# Patient Record
Sex: Male | Born: 1935 | Race: White | Hispanic: No | Marital: Married | Smoking: Current every day smoker
Health system: Southern US, Community
[De-identification: ages and names within clinical notes are randomized; demographics above are authoritative.]

## PROBLEM LIST (undated history)

## (undated) DIAGNOSIS — F0280 Dementia in other diseases classified elsewhere without behavioral disturbance: Secondary | ICD-10-CM

## (undated) DIAGNOSIS — Z9089 Acquired absence of other organs: Secondary | ICD-10-CM

## (undated) DIAGNOSIS — J449 Chronic obstructive pulmonary disease, unspecified: Secondary | ICD-10-CM

## (undated) DIAGNOSIS — F32A Depression, unspecified: Secondary | ICD-10-CM

## (undated) DIAGNOSIS — F419 Anxiety disorder, unspecified: Secondary | ICD-10-CM

## (undated) DIAGNOSIS — C61 Malignant neoplasm of prostate: Secondary | ICD-10-CM

## (undated) DIAGNOSIS — D649 Anemia, unspecified: Secondary | ICD-10-CM

## (undated) HISTORY — DX: Malignant neoplasm of prostate: C61

## (undated) HISTORY — DX: Dementia in other diseases classified elsewhere, unspecified severity, without behavioral disturbance, psychotic disturbance, mood disturbance, and anxiety: F02.80

## (undated) HISTORY — DX: Chronic obstructive pulmonary disease, unspecified: J44.9

## (undated) HISTORY — DX: Anemia, unspecified: D64.9

## (undated) HISTORY — DX: Acquired absence of other organs: Z90.89

## (undated) HISTORY — DX: Depression, unspecified: F32.A

## (undated) HISTORY — PX: OTHER SURGICAL HISTORY: SHX169

## (undated) HISTORY — DX: Anxiety disorder, unspecified: F41.9

---

## 2007-06-27 ENCOUNTER — Ambulatory Visit: Payer: Self-pay | Admitting: Gastroenterology

## 2007-08-22 ENCOUNTER — Ambulatory Visit: Payer: Self-pay | Admitting: Internal Medicine

## 2011-07-28 ENCOUNTER — Ambulatory Visit: Payer: Self-pay | Admitting: Family Medicine

## 2011-07-30 ENCOUNTER — Ambulatory Visit: Payer: Self-pay | Admitting: Internal Medicine

## 2014-07-09 DIAGNOSIS — J449 Chronic obstructive pulmonary disease, unspecified: Secondary | ICD-10-CM | POA: Insufficient documentation

## 2015-04-17 ENCOUNTER — Ambulatory Visit
Admit: 2015-04-17 | Disposition: A | Discharge status: Home / Self Care | Payer: Self-pay | Attending: Family Medicine | Admitting: Family Medicine

## 2015-04-18 ENCOUNTER — Ambulatory Visit
Admit: 2015-04-18 | Disposition: A | Discharge status: Home / Self Care | Payer: Self-pay | Attending: Family Medicine | Admitting: Family Medicine

## 2016-04-26 ENCOUNTER — Telehealth: Payer: Self-pay | Admitting: *Deleted

## 2016-04-26 NOTE — Telephone Encounter (Signed)
Left voicemail message for patient notifying him that it is time to schedule the recommended follow up lung cancer screening low dose CT scan. Requested patient to call back to verify information prior to the scan being scheduled

## 2017-01-06 DIAGNOSIS — R7989 Other specified abnormal findings of blood chemistry: Secondary | ICD-10-CM | POA: Diagnosis not present

## 2017-01-06 DIAGNOSIS — E538 Deficiency of other specified B group vitamins: Secondary | ICD-10-CM | POA: Diagnosis not present

## 2017-01-11 DIAGNOSIS — G629 Polyneuropathy, unspecified: Secondary | ICD-10-CM | POA: Insufficient documentation

## 2017-01-11 DIAGNOSIS — G250 Essential tremor: Secondary | ICD-10-CM | POA: Insufficient documentation

## 2017-01-11 DIAGNOSIS — R2681 Unsteadiness on feet: Secondary | ICD-10-CM | POA: Diagnosis not present

## 2017-01-11 DIAGNOSIS — R269 Unspecified abnormalities of gait and mobility: Secondary | ICD-10-CM | POA: Insufficient documentation

## 2017-01-11 DIAGNOSIS — G588 Other specified mononeuropathies: Secondary | ICD-10-CM | POA: Diagnosis not present

## 2017-02-01 ENCOUNTER — Encounter: Payer: Self-pay | Admitting: Physical Therapy

## 2017-02-01 ENCOUNTER — Ambulatory Visit: Payer: PPO | Attending: Neurology | Admitting: Physical Therapy

## 2017-02-01 DIAGNOSIS — R42 Dizziness and giddiness: Secondary | ICD-10-CM | POA: Diagnosis not present

## 2017-02-01 DIAGNOSIS — R279 Unspecified lack of coordination: Secondary | ICD-10-CM

## 2017-02-01 DIAGNOSIS — R296 Repeated falls: Secondary | ICD-10-CM | POA: Diagnosis not present

## 2017-02-01 DIAGNOSIS — R262 Difficulty in walking, not elsewhere classified: Secondary | ICD-10-CM

## 2017-02-01 DIAGNOSIS — M6281 Muscle weakness (generalized): Secondary | ICD-10-CM | POA: Insufficient documentation

## 2017-02-01 NOTE — Therapy (Signed)
[facility] [address] [place], [place], [address] Phone: [phone]   Fax:  [phone]  Physical Therapy Evaluation  Patient Details  Name: Stanley Jackson MRN: [id number] Date of Birth: May 26, 1936 Referring Provider: Gurney Maxin, MD  Encounter Date: 02/01/2017      PT End of Session - 02/01/17 1529    Visit Number 1   Number of Visits 8   Date for PT Re-Evaluation 03/01/17   Authorization - Visit Number 1   Authorization - Number of Visits 10   PT Start Time 1331   PT Stop Time 1455   PT Time Calculation (min) 84 min   Equipment Utilized During Treatment Gait belt   Activity Tolerance Patient tolerated treatment well   Behavior During Therapy [facility] for tasks assessed/performed;Impulsive      History reviewed. No pertinent past medical history.  History reviewed. No pertinent surgical history.  There were no vitals filed for this visit.       Subjective Assessment - 02/01/17 1509    Subjective Patient is a 81 year old male who presents to his physical therapy evaluation for unstable gait. Patient has noted his gait has been unstable for the past year but believes it has recently gotten better in the past week after purchasing Dr. Darrick Grinder inserts for his shoes.  Patient has heaviness in his legs with dizziness that is worse in the morning. His dizziness occurs when bending over, walking, or being distracted. He reports falling 3-4 times in the last month and having multiple more near falls where he has caught himself. His falls mostly occur outside and he is an avid walker with his dog. The patient reports walking 3 miles every other day with -  mile walks in between for walking his dog. His wife is bedbound and patient is responsible for household care and does everything per patient report. [name] goals include wanting to get rid of his shakiness and decreasing the amount of falls he has.   Limitations  Standing;Walking;House hold activities   Patient Stated Goals decrease falls, decrease tremors   Currently in Pain? No/denies            [facility] PT Assessment - 02/01/17 0001      Assessment   Medical Diagnosis unstable gait   Referring Provider Gurney Maxin, MD   Onset Date/Surgical Date 02/02/16   Prior Therapy no     Precautions   Precautions Fall   Precaution Comments frequent falls     Restrictions   Weight Bearing Restrictions No     Balance Screen   Has the patient fallen in the past 6 months Yes   How many times? 3-4   Has the patient had a decrease in activity level because of a fear of falling?  No   Is the patient reluctant to leave their home because of a fear of falling?  No     Home Environment   Living Environment Private residence   Living Arrangements Spouse/significant other  takes care of bedbound wife      Right Left  Hip flexion 4+/5 4-/5  Hip Abduction 4-/5 4-/5  Hip Adduction 3+/5 3+/5  Knee Extension  4/5 4+/5  Knee Flexion 4/5 4+/5  DF 4+/5 4+/5   Transfers: all transfers caused dizziness Gait: wide BOS, shuffling steps, frequent LOB Walking in // bars with head turns: dizziness Consistent tremors throughout evaluation.  SLS: Unable to hold >2 seconds bilaterally LOB with turns and  distractions  Ambulating with/without cane: more episodes of LOB w/o cane    Gait Trng: Ambulating with cane inside and outside HEP Review: Abductors, adductors, marching, SLS, sit to stands        PT Education - 02/01/17 1455    Education provided Yes   Education Details See HEP.  Instructed in [facility] use indoors/outdoors.   Person(s) Educated Patient   Methods Explanation;Demonstration;Verbal cues;Handout   Comprehension Verbalized understanding;Returned demonstration             PT Long Term Goals - 02/01/17 1551      PT LONG TERM GOAL #1   Title Patient will score >47/56 on the Berg Balance for increased safety in ADLs and progress to  moderate risk for falls.   Baseline Patient scored a 43 on 2/6 and is ranked as significant risk for falls.    Time 4   Period Weeks   Status New     PT LONG TERM GOAL #2   Title Patient will perform 10 sit to stands without use of hands to improve patient's safety within and outside of the home.   Baseline Patient can perform 1 sit to stand with forward LOB. Able to perform 10 with use of single hand   Time 4   Period Weeks   Status New     PT LONG TERM GOAL #3   Title Patient will ambulate 40 feet with no incidences of LOB to decrease patient's risk for falls.    Baseline Patient experiences ~3 LOB every 40 ft.    Time 4   Period Weeks   Status New     PT LONG TERM GOAL #4   Title Patient will be I in HEP for increased LE MMT 4+/5 bilaterally for increased ability to perform ADLs in a safe manner.    Baseline Patient has a generalized LE MMT of 4-/5 bilaterally   Time 4   Period Weeks   Status New               Plan - 02/01/17 1548    Clinical Impression Statement Patient is a 81 year old male who presents to his physical therapy evaluation for unstable gait and falls. The patient has had multiple falls in the past month and is unsteady in standing statically and dynamically. The patient scored a 43/56 on the Berg Balance Score placing him at a significant risk for falls. His score was affected by episodes of dizziness, stumbling upon turning,  and limited balance. Dizziness was experienced upon transfers, bending over, and walking past objects that caught his attention. The patient frequently experienced LOB when ambulating in hall when distracted by open doors, people, and noises. He was able to perform head turns while walking in parrallel bars with dizziness and one episode of LOB.  The episodes of dizziness resolve quickly within 5-7 seconds. Transitioning from sit to stand without use of hands is difficult for the patient and causes excessive dizziness and forward LOB.  The patient's wide BOS and shuffling gait is also influenced by his weak abductors and adductors (4-/5 MMT bilaterally).  Patient is unable to maintain single limb stance >2 seconds bilaterally, lifting and then immediately placing back down, similar to his swing phase in gait.  The patient experiences tremors that improve with exercise.  Patient would benefit from skilled physical therapy to improve balance, decrease risk for falls, increase LE strength, and improve safety in activities of daily living.    Rehab Potential  Fair   Clinical Impairments Affecting Rehab Potential hx of falls, personal hx,    PT Frequency 2x / week   PT Duration 4 weeks   PT Treatment/Interventions ADLs/Self Care Home Management;Aquatic Therapy;Biofeedback;Electrical Stimulation;Moist Heat;DME Instruction;Gait training;Stair training;Functional mobility training;Therapeutic activities;Therapeutic exercise;Balance training;Neuromuscular re-education;Patient/family education;Manual techniques;Passive range of motion;Energy conservation;Taping;Vestibular;Visual/perceptual remediation/compensation   PT Next Visit Plan review HEP, vestibular testing, cane education   PT Home Exercise Plan see sheet   Consulted and Agree with Plan of Care Patient      Patient will benefit from skilled therapeutic intervention in order to improve the following deficits and impairments:  Abnormal gait, Decreased activity tolerance, Decreased balance, Decreased coordination, Decreased endurance, Decreased mobility, Decreased knowledge of use of DME, Decreased safety awareness, Difficulty walking, Decreased strength, Dizziness, Improper body mechanics, Postural dysfunction  Visit Diagnosis: Repeated falls  Difficulty in walking, not elsewhere classified  Dizziness and giddiness  Unspecified lack of coordination  Muscle weakness (generalized)      G-Codes - 02/17/2017 1649    Functional Assessment Tool Used Clinical impression/ gait  difficulty/ muscle weakness/ Berg/ balance issues.     Functional Limitation Mobility: Walking and moving around   Mobility: Walking and Moving Around Current Status [phone]) At least 40 percent but less than 60 percent impaired, limited or restricted   Mobility: Walking and Moving Around Goal Status [phone]) At least 1 percent but less than 20 percent impaired, limited or restricted       Problem List There are no active problems to display for this patient.  [name], PT, DPT # [id number] Janna Arch, SPT 02/02/2017, 7:50 AM  [place] [facility] [address] [place], [place], [address] Phone: [phone]   Fax:  [phone]  Name: VERNA EANS MRN: [id number] Date of Birth: 06-12-1936

## 2017-02-02 ENCOUNTER — Encounter: Payer: Self-pay | Admitting: Physical Therapy

## 2017-02-04 ENCOUNTER — Ambulatory Visit: Payer: PPO | Admitting: Physical Therapy

## 2017-02-04 DIAGNOSIS — R296 Repeated falls: Secondary | ICD-10-CM | POA: Diagnosis not present

## 2017-02-04 DIAGNOSIS — M6281 Muscle weakness (generalized): Secondary | ICD-10-CM

## 2017-02-04 DIAGNOSIS — R262 Difficulty in walking, not elsewhere classified: Secondary | ICD-10-CM

## 2017-02-04 DIAGNOSIS — R42 Dizziness and giddiness: Secondary | ICD-10-CM

## 2017-02-04 DIAGNOSIS — R279 Unspecified lack of coordination: Secondary | ICD-10-CM

## 2017-02-05 NOTE — Therapy (Signed)
[facility] [facility] [address] [place], [place], [address] Phone: [phone]   Fax:  [phone]  Physical Therapy Treatment  Patient Details  Name: Stanley Jackson MRN: [id number] Date of Birth: September 29, 1936 Referring Provider: Gurney Maxin, MD  Encounter Date: 02/04/2017      PT End of Session - 02/04/17 1022    Visit Number 2   Number of Visits 8   Date for PT Re-Evaluation 03/01/17   Authorization - Visit Number 2   Authorization - Number of Visits 10   PT Start Time [id number]   PT Stop Time 1111   PT Time Calculation (min) 52 min   Equipment Utilized During Treatment Gait belt   Activity Tolerance Patient tolerated treatment well   Behavior During Therapy [facility] for tasks assessed/performed      No past medical history on file.  No past surgical history on file.  There were no vitals filed for this visit.      Subjective Assessment - 02/04/17 1020    Subjective Pt. presents today with no c/o pain and reports he is doing well today.    Limitations Standing;Walking;House hold activities   Patient Stated Goals decrease falls, decrease tremors   Currently in Pain? No/denies       OBJECTIVE:  There.ex.: Reviewed HEP.  Scifit L6 10 min. B UE/LE (warm-up/no charge).  TG knee flexion/ single leg/ heel raises 30x.  Neuro: balance training with Airex/ BOSU (SLS/ wt. Shifting/ limited UE assist). SLS with finger tip assist (15 sec.).  5 seconds on R and 8 seconds on L with no UE assist.  Walking in clinic with no assistive device working on proper step pattern/ upright posture.  Airex: marching/ EO and EC tasks.      Pt response for medical necessity:  Benefits from B LE muscle strengthening to improve safety and independence with standing/walking tasks.  Good overall tx. Tolerance with several small LOB and able to self correct UE assist.        PT Long Term Goals - 02/01/17 1551      PT LONG TERM GOAL #1   Title Patient will score  >47/56 on the Berg Balance for increased safety in ADLs and progress to moderate risk for falls.   Baseline Patient scored a 43 on 2/6 and is ranked as significant risk for falls.    Time 4   Period Weeks   Status New     PT LONG TERM GOAL #2   Title Patient will perform 10 sit to stands without use of hands to improve patient's safety within and outside of the home.   Baseline Patient can perform 1 sit to stand with forward LOB. Able to perform 10 with use of single hand   Time 4   Period Weeks   Status New     PT LONG TERM GOAL #3   Title Patient will ambulate 40 feet with no incidences of LOB to decrease patient's risk for falls.    Baseline Patient experiences ~3 LOB every 40 ft.    Time 4   Period Weeks   Status New     PT LONG TERM GOAL #4   Title Patient will be I in HEP for increased LE MMT 4+/5 bilaterally for increased ability to perform ADLs in a safe manner.    Baseline Patient has a generalized LE MMT of 4-/5 bilaterally   Time 4   Period Weeks   Status New  Plan - 02/05/17 1500    Clinical Impression Statement Pt. works hard during tx. session and progressing well with higher level balance tasks in/out of //-bars.  Pt. requires use of gait belt for safety due to several small LOB with ability to self correct with UE assist.  Pt. reports no pain during tx. session.  Pt. is not interested in use of SPC with walking at this time.  Less overall dizziness reported today as compared to initial evaluation.     Rehab Potential Fair   Clinical Impairments Affecting Rehab Potential hx of falls, personal hx,    PT Frequency 2x / week   PT Duration 4 weeks   PT Treatment/Interventions ADLs/Self Care Home Management;Aquatic Therapy;Biofeedback;Electrical Stimulation;Moist Heat;DME Instruction;Gait training;Stair training;Functional mobility training;Therapeutic activities;Therapeutic exercise;Balance training;Neuromuscular re-education;Patient/family education;Manual  techniques;Passive range of motion;Energy conservation;Taping;Vestibular;Visual/perceptual remediation/compensation   PT Next Visit Plan review HEP, vestibular testing, cane education   PT Home Exercise Plan see sheet      Patient will benefit from skilled therapeutic intervention in order to improve the following deficits and impairments:  Abnormal gait, Decreased activity tolerance, Decreased balance, Decreased coordination, Decreased endurance, Decreased mobility, Decreased knowledge of use of DME, Decreased safety awareness, Difficulty walking, Decreased strength, Dizziness, Improper body mechanics, Postural dysfunction  Visit Diagnosis: Repeated falls  Difficulty in walking, not elsewhere classified  Dizziness and giddiness  Unspecified lack of coordination  Muscle weakness (generalized)     Problem List There are no active problems to display for this patient.  [name], PT, DPT # [phone]  02/05/2017, 3:08 PM  [place] [facility] [address] [place], [place], [address] Phone: [phone]   Fax:  [phone]  Name: Stanley Jackson MRN: [id number] Date of Birth: 1936-02-19

## 2017-02-07 DIAGNOSIS — R7989 Other specified abnormal findings of blood chemistry: Secondary | ICD-10-CM | POA: Diagnosis not present

## 2017-02-07 DIAGNOSIS — E538 Deficiency of other specified B group vitamins: Secondary | ICD-10-CM | POA: Diagnosis not present

## 2017-02-08 ENCOUNTER — Ambulatory Visit: Payer: PPO | Admitting: Physical Therapy

## 2017-02-09 ENCOUNTER — Ambulatory Visit: Payer: PPO | Admitting: Physical Therapy

## 2017-02-09 ENCOUNTER — Encounter: Payer: Self-pay | Admitting: Physical Therapy

## 2017-02-09 DIAGNOSIS — R279 Unspecified lack of coordination: Secondary | ICD-10-CM

## 2017-02-09 DIAGNOSIS — R296 Repeated falls: Secondary | ICD-10-CM | POA: Diagnosis not present

## 2017-02-09 DIAGNOSIS — M6281 Muscle weakness (generalized): Secondary | ICD-10-CM

## 2017-02-09 DIAGNOSIS — R262 Difficulty in walking, not elsewhere classified: Secondary | ICD-10-CM

## 2017-02-09 DIAGNOSIS — R42 Dizziness and giddiness: Secondary | ICD-10-CM

## 2017-02-09 NOTE — Therapy (Signed)
[place] [facility] [address] [place], [place], [address] Phone: [phone]   Fax:  [phone]  Physical Therapy Treatment  Patient Details  Name: Stanley Jackson MRN: [id number] Date of Birth: January 24, 1936 Referring Provider: Gurney Maxin, MD  Encounter Date: 02/09/2017      PT End of Session - 02/09/17 1131    Visit Number 3   Number of Visits 8   Date for PT Re-Evaluation 03/01/17   Authorization - Visit Number 3   Authorization - Number of Visits 10   PT Start Time [id number]   PT Stop Time [id number]   PT Time Calculation (min) 48 min   Equipment Utilized During Treatment Gait belt   Activity Tolerance Patient tolerated treatment well   Behavior During Therapy [facility] for tasks assessed/performed      History reviewed. No pertinent past medical history.  History reviewed. No pertinent surgical history.  There were no vitals filed for this visit.      Subjective Assessment - 02/09/17 1130    Subjective Pt. reports no new complaints today and played with his granddaughter over the weekend.    Limitations Standing;Walking;House hold activities   Patient Stated Goals decrease falls, decrease tremors   Currently in Pain? No/denies     OBJECTIVE:   Neuro: Navigating obstacle course in clinic: weaving through 3 cones with no LOB, step up onto 6" step, medicine ball rebounds standing on airex pad. Min. A req.to maintain balance during dynamic throwing exercise. Marland Kitchen Perturbations standing with eyes open progressed to eyes closed with CGA for safety. Pt. showed a tendency to lean trunk forward when feeling off-balance. Pt. used visual feedback in mirror to correct posture and head alignment. Sit to stands no UE assist. Pt. Showing improvement from last week with control during stand to sit. Multiple trials of ambulation through cones carrying weighted box, pt. Reported less dizziness from initial evaluation. Standing medicine ball passing with rotation  15x and overhead passing 15x. No LOB noted    There.ex.: Squat to pick up medicine ball w/ PT cuing to increase B knee flexion during squat. TG knee flexion double/single leg/heel raises 30x. Pt. Provided cuing to perform in a slow and controlled manner (L side stronger than R). Pt. Needed tactile cuing to maintain knee extension during heel raises. Ambulation with weighted box 7.5# inside box. Standing marching ankle weights w/ no UE assist, standing abduction w/ ankle weights and support on //-bars. Tendency to lean forward. SciFit L4.5 (no charge)   Pt response for medical necessity:  Benefits from B LE muscle strengthening to improve safety and independence with standing/walking tasks. Tolerance with several small LOB and able to self correct UE assist.           PT Long Term Goals - 02/01/17 1551      PT LONG TERM GOAL #1   Title Patient will score >47/56 on the Berg Balance for increased safety in ADLs and progress to moderate risk for falls.   Baseline Patient scored a 43 on 2/6 and is ranked as significant risk for falls.    Time 4   Period Weeks   Status New     PT LONG TERM GOAL #2   Title Patient will perform 10 sit to stands without use of hands to improve patient's safety within and outside of the home.   Baseline Patient can perform 1 sit to stand with forward LOB. Able to perform 10 with use of single hand  Time 4   Period Weeks   Status New     PT LONG TERM GOAL #3   Title Patient will ambulate 40 feet with no incidences of LOB to decrease patient's risk for falls.    Baseline Patient experiences ~3 LOB every 40 ft.    Time 4   Period Weeks   Status New     PT LONG TERM GOAL #4   Title Patient will be I in HEP for increased LE MMT 4+/5 bilaterally for increased ability to perform ADLs in a safe manner.    Baseline Patient has a generalized LE MMT of 4-/5 bilaterally   Time 4   Period Weeks   Status New            Plan - 02/09/17 1132    Clinical  Impression Statement Pt. continuing to progress with higher level balance tasks outside of parallel bars. Pt. able to navigate obstacle course within PT clinic working on pivoting/turning during gait, step ups to 12" steps, and dynamic activities on the airex. Pt. ambulates with a slight shuffling gait pattern showing a decreased heel strike and short step length. Pt. experiences brief spells of dizziness occasionally but it does not affect the pt's ability to participate in therapy.  Pt. progressing to standing balance tasks with eyes closed to increase difficulty. Pt's overall endurance is improving with ambulating tasks and while on the bicycle.    Rehab Potential Fair   Clinical Impairments Affecting Rehab Potential hx of falls, personal hx,    PT Frequency 2x / week   PT Duration 4 weeks   PT Treatment/Interventions ADLs/Self Care Home Management;Aquatic Therapy;Biofeedback;Electrical Stimulation;Moist Heat;DME Instruction;Gait training;Stair training;Functional mobility training;Therapeutic activities;Therapeutic exercise;Balance training;Neuromuscular re-education;Patient/family education;Manual techniques;Passive range of motion;Energy conservation;Taping;Vestibular;Visual/perceptual remediation/compensation   PT Next Visit Plan review HEP, vestibular testing, cane education   PT Home Exercise Plan see sheet   Consulted and Agree with Plan of Care Patient      Patient will benefit from skilled therapeutic intervention in order to improve the following deficits and impairments:  Abnormal gait, Decreased activity tolerance, Decreased balance, Decreased coordination, Decreased endurance, Decreased mobility, Decreased knowledge of use of DME, Decreased safety awareness, Difficulty walking, Decreased strength, Dizziness, Improper body mechanics, Postural dysfunction  Visit Diagnosis: Repeated falls  Difficulty in walking, not elsewhere classified  Dizziness and giddiness  Unspecified lack of  coordination  Muscle weakness (generalized)     Problem List There are no active problems to display for this patient.  [name], PT, DPT # [id number] [name], SPT 02/09/2017, 1:25 PM  [place] [facility] [address] [place], [place], [address] Phone: [phone]   Fax:  [phone]  Name: DELANDO PUJOLS MRN: [id number] Date of Birth: 26-Aug-1936

## 2017-02-16 ENCOUNTER — Ambulatory Visit: Payer: PPO | Admitting: Physical Therapy

## 2017-02-16 DIAGNOSIS — R296 Repeated falls: Secondary | ICD-10-CM | POA: Diagnosis not present

## 2017-02-16 DIAGNOSIS — R262 Difficulty in walking, not elsewhere classified: Secondary | ICD-10-CM

## 2017-02-16 DIAGNOSIS — R279 Unspecified lack of coordination: Secondary | ICD-10-CM

## 2017-02-16 DIAGNOSIS — M6281 Muscle weakness (generalized): Secondary | ICD-10-CM

## 2017-02-16 DIAGNOSIS — R42 Dizziness and giddiness: Secondary | ICD-10-CM
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
## 2017-02-23 ENCOUNTER — Ambulatory Visit: Payer: PPO | Admitting: Physical Therapy

## 2017-03-02 ENCOUNTER — Ambulatory Visit: Payer: PPO | Attending: Neurology | Admitting: Physical Therapy

## 2017-03-10 DIAGNOSIS — R7989 Other specified abnormal findings of blood chemistry: Secondary | ICD-10-CM | POA: Diagnosis not present

## 2017-03-10 DIAGNOSIS — E538 Deficiency of other specified B group vitamins: Secondary | ICD-10-CM | POA: Diagnosis not present

## 2017-03-24 DIAGNOSIS — Z0001 Encounter for general adult medical examination with abnormal findings: Secondary | ICD-10-CM | POA: Diagnosis not present

## 2017-03-24 DIAGNOSIS — E538 Deficiency of other specified B group vitamins: Secondary | ICD-10-CM | POA: Diagnosis not present

## 2017-03-24 DIAGNOSIS — J449 Chronic obstructive pulmonary disease, unspecified: Secondary | ICD-10-CM | POA: Diagnosis not present

## 2017-03-24 DIAGNOSIS — Z720 Tobacco use: Secondary | ICD-10-CM | POA: Diagnosis not present

## 2017-03-24 DIAGNOSIS — Z0000 Encounter for general adult medical examination without abnormal findings: Secondary | ICD-10-CM | POA: Diagnosis not present

## 2017-04-11 DIAGNOSIS — E538 Deficiency of other specified B group vitamins: Secondary | ICD-10-CM | POA: Diagnosis not present

## 2017-04-11 DIAGNOSIS — R7989 Other specified abnormal findings of blood chemistry: Secondary | ICD-10-CM | POA: Diagnosis not present

## 2017-04-19 DIAGNOSIS — G588 Other specified mononeuropathies: Secondary | ICD-10-CM | POA: Diagnosis not present

## 2017-04-19 DIAGNOSIS — R2681 Unsteadiness on feet: Secondary | ICD-10-CM | POA: Diagnosis not present

## 2017-04-19 DIAGNOSIS — G250 Essential tremor: Secondary | ICD-10-CM | POA: Diagnosis not present

## 2017-04-21 DIAGNOSIS — J449 Chronic obstructive pulmonary disease, unspecified: Secondary | ICD-10-CM | POA: Diagnosis not present

## 2017-04-21 DIAGNOSIS — R05 Cough: Secondary | ICD-10-CM | POA: Diagnosis not present

## 2017-04-22 ENCOUNTER — Other Ambulatory Visit: Payer: Self-pay | Admitting: Specialist

## 2017-04-22 DIAGNOSIS — R05 Cough: Secondary | ICD-10-CM

## 2017-04-22 DIAGNOSIS — J449 Chronic obstructive pulmonary disease, unspecified: Secondary | ICD-10-CM

## 2017-04-22 DIAGNOSIS — R059 Cough, unspecified: Secondary | ICD-10-CM

## 2017-04-29 ENCOUNTER — Ambulatory Visit
Admission: RE | Admit: 2017-04-29 | Discharge: 2017-04-29 | Disposition: A | Discharge status: Home / Self Care | Payer: PPO | Source: Ambulatory Visit | Attending: Specialist | Admitting: Specialist

## 2017-04-29 DIAGNOSIS — I700 Atherosclerosis of aorta: Secondary | ICD-10-CM | POA: Insufficient documentation

## 2017-04-29 DIAGNOSIS — M47814 Spondylosis without myelopathy or radiculopathy, thoracic region: Secondary | ICD-10-CM | POA: Insufficient documentation

## 2017-04-29 DIAGNOSIS — R05 Cough: Secondary | ICD-10-CM | POA: Insufficient documentation

## 2017-04-29 DIAGNOSIS — R059 Cough, unspecified: Secondary | ICD-10-CM

## 2017-04-29 DIAGNOSIS — N62 Hypertrophy of breast: Secondary | ICD-10-CM | POA: Diagnosis not present

## 2017-04-29 DIAGNOSIS — R918 Other nonspecific abnormal finding of lung field: Secondary | ICD-10-CM | POA: Diagnosis not present

## 2017-04-29 DIAGNOSIS — J449 Chronic obstructive pulmonary disease, unspecified: Secondary | ICD-10-CM | POA: Diagnosis not present

## 2017-05-12 DIAGNOSIS — R7989 Other specified abnormal findings of blood chemistry: Secondary | ICD-10-CM | POA: Diagnosis not present

## 2017-05-12 DIAGNOSIS — E538 Deficiency of other specified B group vitamins: Secondary | ICD-10-CM | POA: Diagnosis not present

## 2017-06-16 DIAGNOSIS — R7989 Other specified abnormal findings of blood chemistry: Secondary | ICD-10-CM | POA: Diagnosis not present

## 2017-07-03 IMAGING — CT CT CHEST W/O CM
1 series · 15 of 34 positions shown, 19 images · non-contrast
Comparison: Lung cancer screening chest CT 04/18/2015. Chest CT
08/22/2007.

CLINICAL DATA: Current smoker with history of COPD. Slight cough.
No history of malignancy. Patient no longer qualifies for lung
cancer screening program.

EXAM:
CT CHEST WITHOUT CONTRAST
TECHNIQUE: Multidetector CT imaging of the chest was performed following the
standard protocol without IV contrast.

[Series 2: thorax · axial · 0.65mm/px · z∈[-566,-294]mm · 15 of 160 slices shown, 19 images]
[im 12/160  mediastinal]
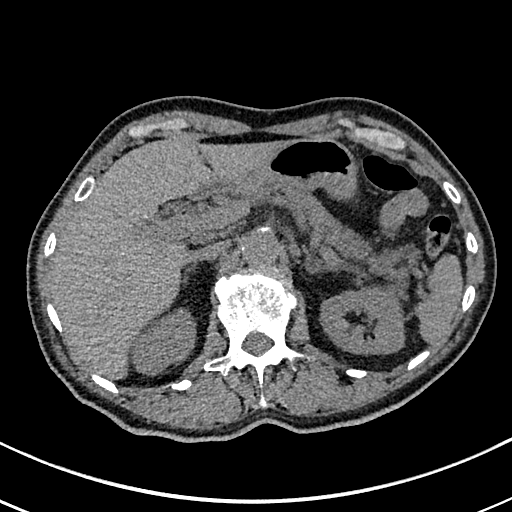
[im 12/160  lung]
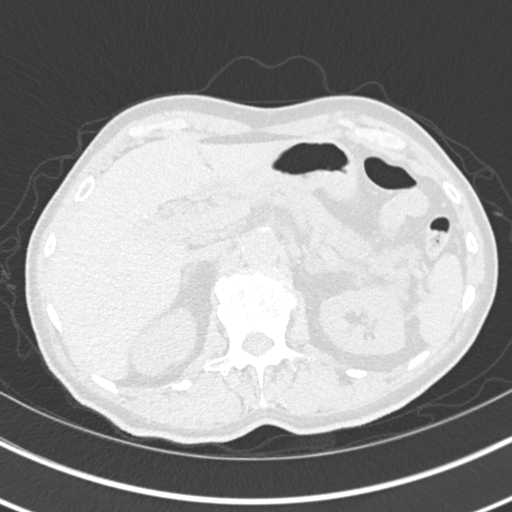
[im 24/160  lung]
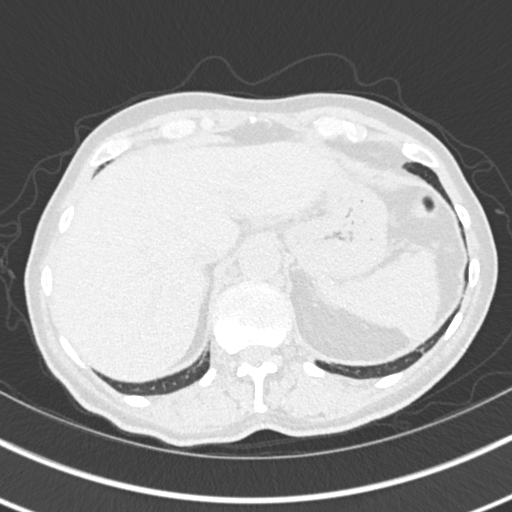
[im 32/160  lung]
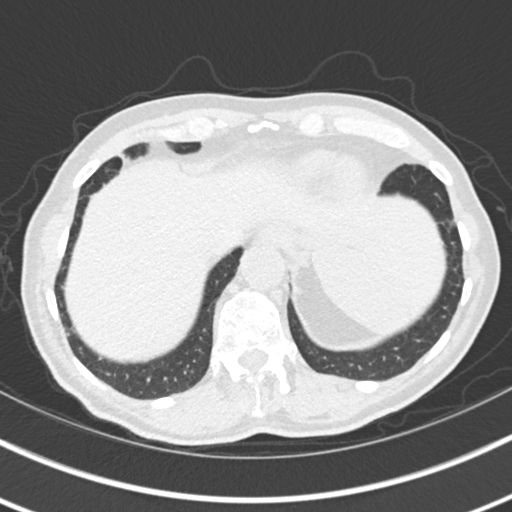
[im 42/160  lung]
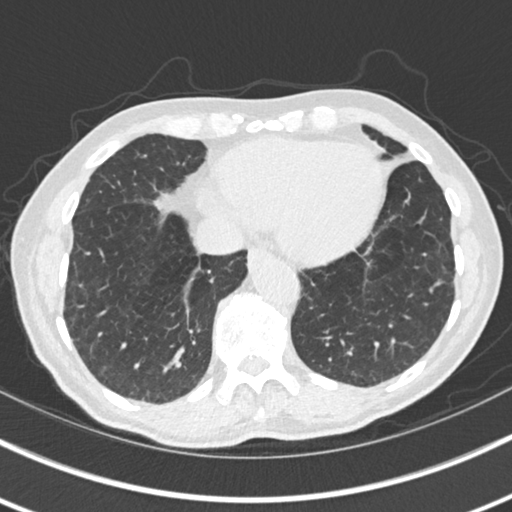
[im 54/160  mediastinal]
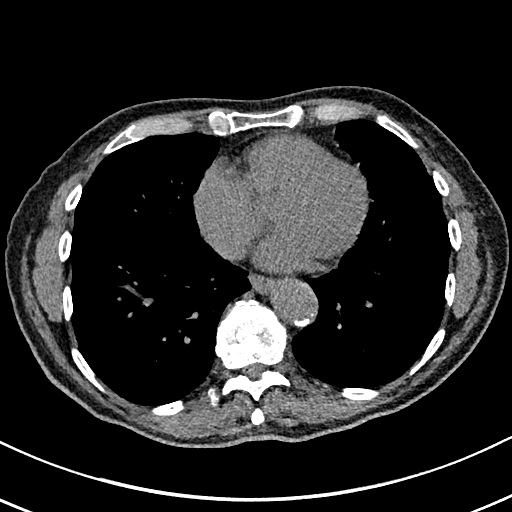
[im 54/160  lung]
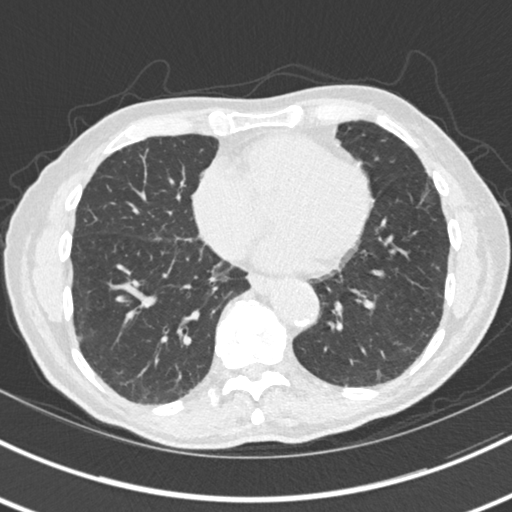
[im 64/160  lung]
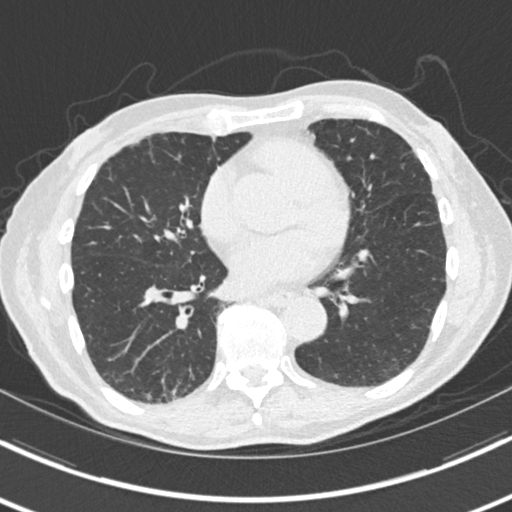
[im 71/160  lung]
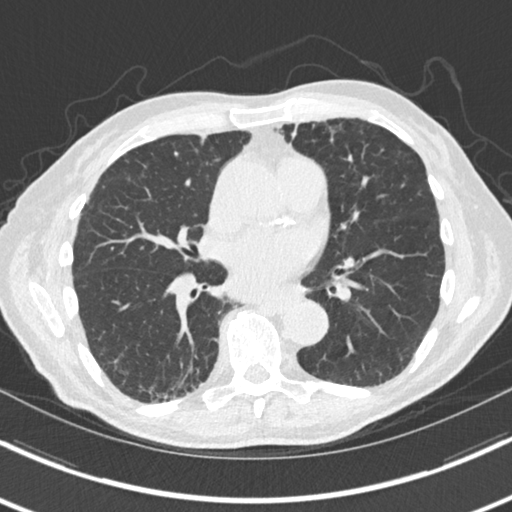
[im 83/160  lung]
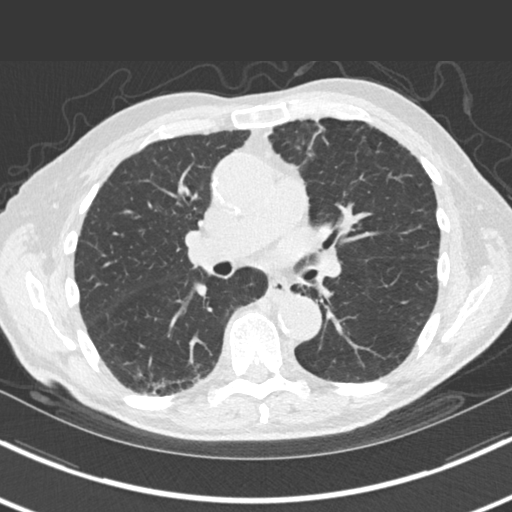
[im 89/160  mediastinal]
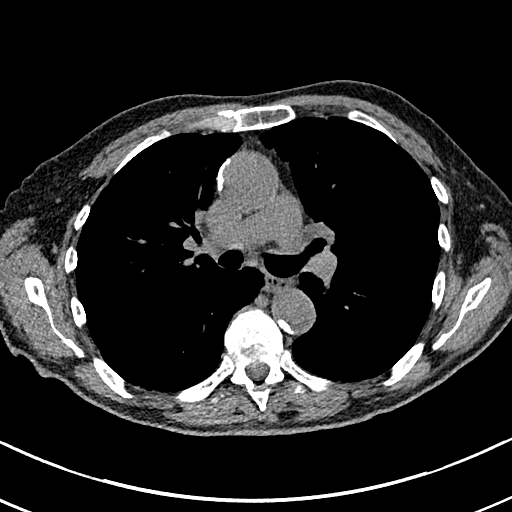
[im 89/160  lung]
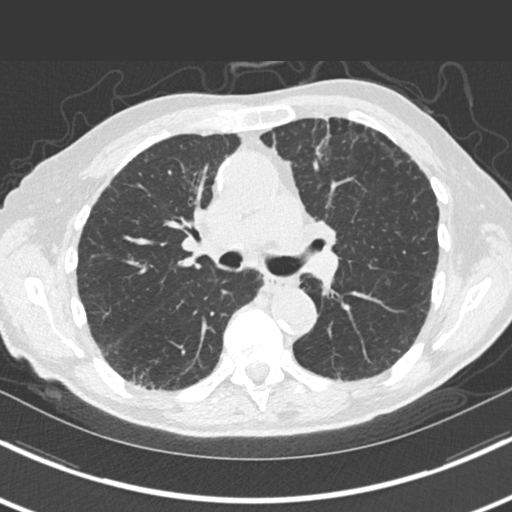
[im 96/160  lung]
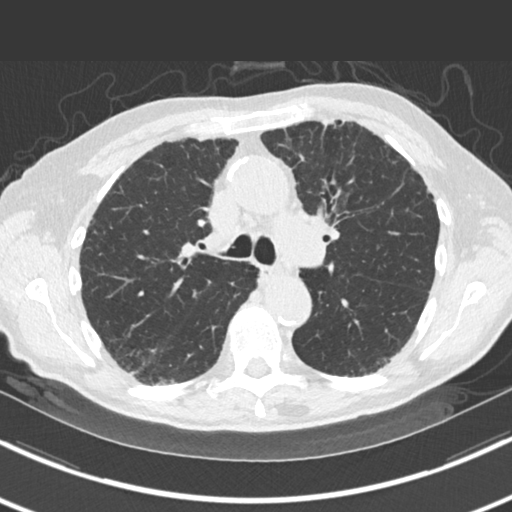
[im 107/160  lung]
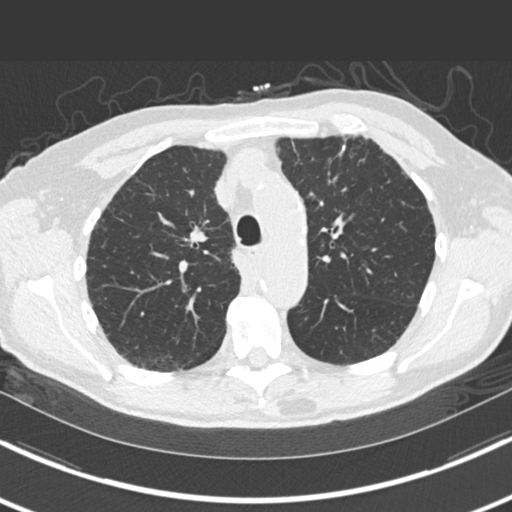
[im 118/160  lung]
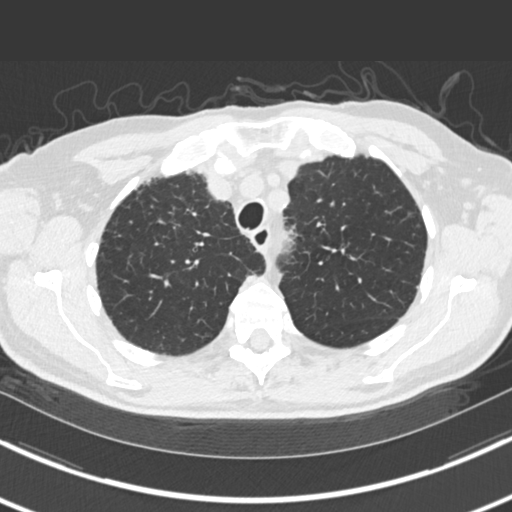
[im 128/160  mediastinal]
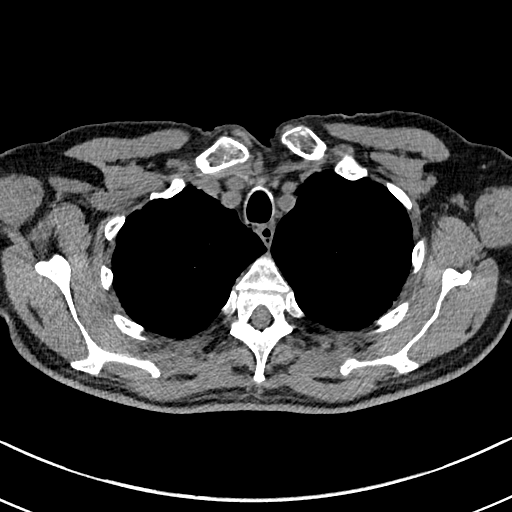
[im 128/160  lung]
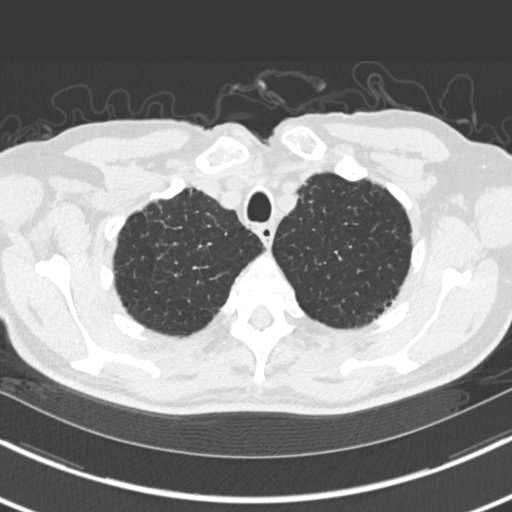
[im 136/160  lung]
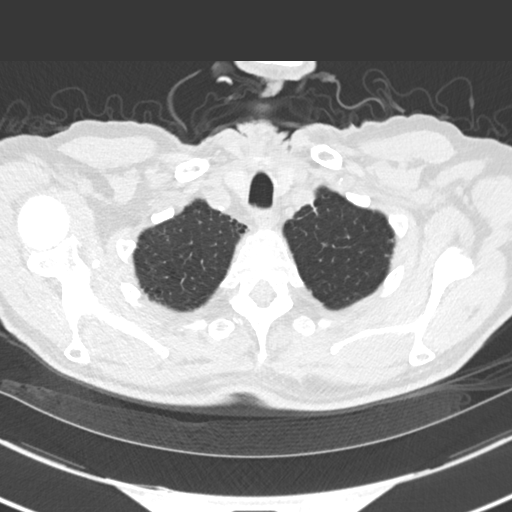
[im 148/160  lung]
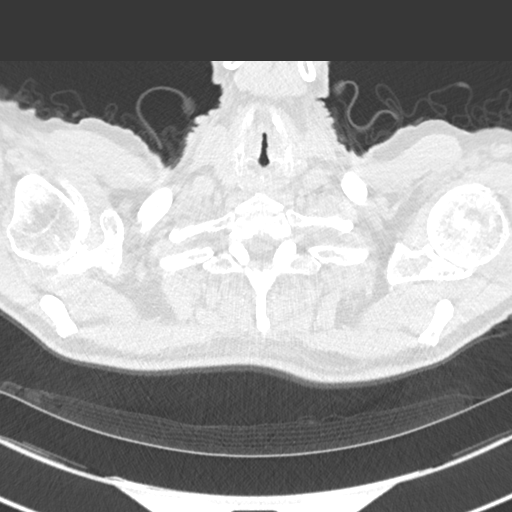

[15 of 34 positions shown; findings below may reference images not displayed]

FINDINGS: Cardiovascular: Atherosclerosis of aorta, great vessels and coronary
arteries. No acute vascular findings are seen on noncontrast
imaging. The heart size is normal. There is no pericardial effusion.

Mediastinum/Nodes: There are no enlarged mediastinal, hilar or
axillary lymph nodes.Hilar assessment is limited by the lack of
intravenous contrast, although the hilar contours appear unchanged.
The thyroid gland, trachea and esophagus demonstrate no significant
findings.

Lungs/Pleura: There is no pleural effusion. Again demonstrated is
mild emphysema with central airway thickening and scattered
subpleural reticulation. Minimal subpleural nodularity is stable.
There are no suspicious pulmonary nodules or confluent airspace
opacities.

Upper abdomen:  Unremarkable.  There is no adrenal mass.

Musculoskeletal/Chest wall: There is no chest wall mass or
suspicious osseous finding. Thoracic spine degenerative changes and
mild bilateral gynecomastia noted.
IMPRESSION: 1. Stable chronic lung disease with emphysema, subpleural
reticulation and scattered minimal nodularity. No suspicious
pulmonary nodules.
2. No acute chest findings or adenopathy.
3.  Aortic Atherosclerosis ([id number]).

## 2017-09-01 DIAGNOSIS — Z720 Tobacco use: Secondary | ICD-10-CM | POA: Diagnosis not present

## 2017-09-01 DIAGNOSIS — E538 Deficiency of other specified B group vitamins: Secondary | ICD-10-CM | POA: Diagnosis not present

## 2017-09-01 DIAGNOSIS — G588 Other specified mononeuropathies: Secondary | ICD-10-CM | POA: Diagnosis not present

## 2017-09-01 DIAGNOSIS — J449 Chronic obstructive pulmonary disease, unspecified: Secondary | ICD-10-CM | POA: Diagnosis not present

## 2017-10-27 DIAGNOSIS — J449 Chronic obstructive pulmonary disease, unspecified: Secondary | ICD-10-CM | POA: Diagnosis not present

## 2017-10-27 DIAGNOSIS — R0609 Other forms of dyspnea: Secondary | ICD-10-CM | POA: Diagnosis not present

## 2017-10-27 DIAGNOSIS — Z23 Encounter for immunization: Secondary | ICD-10-CM | POA: Diagnosis not present

## 2018-03-27 DIAGNOSIS — E538 Deficiency of other specified B group vitamins: Secondary | ICD-10-CM | POA: Diagnosis not present

## 2018-03-27 DIAGNOSIS — Z0001 Encounter for general adult medical examination with abnormal findings: Secondary | ICD-10-CM | POA: Diagnosis not present

## 2018-03-27 DIAGNOSIS — J449 Chronic obstructive pulmonary disease, unspecified: Secondary | ICD-10-CM | POA: Diagnosis not present

## 2018-03-27 DIAGNOSIS — Z5181 Encounter for therapeutic drug level monitoring: Secondary | ICD-10-CM | POA: Diagnosis not present

## 2018-03-27 DIAGNOSIS — Z0000 Encounter for general adult medical examination without abnormal findings: Secondary | ICD-10-CM | POA: Diagnosis not present

## 2018-04-25 DIAGNOSIS — E538 Deficiency of other specified B group vitamins: Secondary | ICD-10-CM | POA: Diagnosis not present

## 2018-06-21 DIAGNOSIS — R911 Solitary pulmonary nodule: Secondary | ICD-10-CM | POA: Diagnosis not present

## 2018-06-21 DIAGNOSIS — R0602 Shortness of breath: Secondary | ICD-10-CM | POA: Diagnosis not present

## 2018-06-21 DIAGNOSIS — R0609 Other forms of dyspnea: Secondary | ICD-10-CM | POA: Diagnosis not present

## 2018-06-21 DIAGNOSIS — J449 Chronic obstructive pulmonary disease, unspecified: Secondary | ICD-10-CM | POA: Diagnosis not present

## 2018-06-21 DIAGNOSIS — Z720 Tobacco use: Secondary | ICD-10-CM | POA: Diagnosis not present

## 2018-06-22 ENCOUNTER — Other Ambulatory Visit: Payer: Self-pay | Admitting: Specialist

## 2018-06-22 DIAGNOSIS — R0602 Shortness of breath: Secondary | ICD-10-CM

## 2018-08-30 DIAGNOSIS — H2513 Age-related nuclear cataract, bilateral: Secondary | ICD-10-CM | POA: Diagnosis not present

## 2018-09-04 ENCOUNTER — Ambulatory Visit: Admission: RE | Admit: 2018-09-04 | Payer: PPO | Source: Ambulatory Visit

## 2018-09-13 ENCOUNTER — Ambulatory Visit
Admission: RE | Admit: 2018-09-13 | Discharge: 2018-09-13 | Disposition: A | Discharge status: Home / Self Care | Payer: PPO | Source: Ambulatory Visit | Attending: Specialist | Admitting: Specialist

## 2018-09-13 DIAGNOSIS — I700 Atherosclerosis of aorta: Secondary | ICD-10-CM | POA: Diagnosis not present

## 2018-09-13 DIAGNOSIS — R0602 Shortness of breath: Secondary | ICD-10-CM

## 2018-09-13 DIAGNOSIS — J439 Emphysema, unspecified: Secondary | ICD-10-CM | POA: Diagnosis not present

## 2018-09-19 DIAGNOSIS — Z5181 Encounter for therapeutic drug level monitoring: Secondary | ICD-10-CM | POA: Diagnosis not present

## 2018-09-19 DIAGNOSIS — E538 Deficiency of other specified B group vitamins: Secondary | ICD-10-CM | POA: Diagnosis not present

## 2018-09-26 DIAGNOSIS — Z720 Tobacco use: Secondary | ICD-10-CM | POA: Diagnosis not present

## 2018-09-26 DIAGNOSIS — Z13220 Encounter for screening for lipoid disorders: Secondary | ICD-10-CM | POA: Diagnosis not present

## 2018-09-26 DIAGNOSIS — F329 Major depressive disorder, single episode, unspecified: Secondary | ICD-10-CM | POA: Diagnosis not present

## 2018-09-26 DIAGNOSIS — E538 Deficiency of other specified B group vitamins: Secondary | ICD-10-CM | POA: Diagnosis not present

## 2018-09-26 DIAGNOSIS — G250 Essential tremor: Secondary | ICD-10-CM | POA: Diagnosis not present

## 2018-09-26 DIAGNOSIS — J449 Chronic obstructive pulmonary disease, unspecified: Secondary | ICD-10-CM | POA: Diagnosis not present

## 2018-09-26 DIAGNOSIS — F32A Depression, unspecified: Secondary | ICD-10-CM | POA: Insufficient documentation

## 2018-09-26 DIAGNOSIS — Z23 Encounter for immunization: Secondary | ICD-10-CM | POA: Diagnosis not present

## 2018-09-26 DIAGNOSIS — G588 Other specified mononeuropathies: Secondary | ICD-10-CM | POA: Diagnosis not present

## 2018-12-26 DIAGNOSIS — F329 Major depressive disorder, single episode, unspecified: Secondary | ICD-10-CM | POA: Diagnosis not present

## 2018-12-26 DIAGNOSIS — J449 Chronic obstructive pulmonary disease, unspecified: Secondary | ICD-10-CM | POA: Diagnosis not present

## 2018-12-26 DIAGNOSIS — G250 Essential tremor: Secondary | ICD-10-CM | POA: Diagnosis not present

## 2019-01-24 DIAGNOSIS — J449 Chronic obstructive pulmonary disease, unspecified: Secondary | ICD-10-CM | POA: Diagnosis not present

## 2019-01-24 DIAGNOSIS — R05 Cough: Secondary | ICD-10-CM | POA: Diagnosis not present

## 2019-01-24 DIAGNOSIS — R0609 Other forms of dyspnea: Secondary | ICD-10-CM | POA: Diagnosis not present

## 2019-02-06 DIAGNOSIS — F329 Major depressive disorder, single episode, unspecified: Secondary | ICD-10-CM | POA: Diagnosis not present

## 2019-02-06 DIAGNOSIS — G250 Essential tremor: Secondary | ICD-10-CM | POA: Diagnosis not present

## 2019-02-06 DIAGNOSIS — Z720 Tobacco use: Secondary | ICD-10-CM | POA: Diagnosis not present

## 2019-02-06 DIAGNOSIS — J449 Chronic obstructive pulmonary disease, unspecified: Secondary | ICD-10-CM | POA: Diagnosis not present

## 2019-03-29 DIAGNOSIS — G40909 Epilepsy, unspecified, not intractable, without status epilepticus: Secondary | ICD-10-CM | POA: Diagnosis not present

## 2019-03-29 DIAGNOSIS — G588 Other specified mononeuropathies: Secondary | ICD-10-CM | POA: Diagnosis not present

## 2019-03-29 DIAGNOSIS — K219 Gastro-esophageal reflux disease without esophagitis: Secondary | ICD-10-CM | POA: Diagnosis not present

## 2019-03-29 DIAGNOSIS — E538 Deficiency of other specified B group vitamins: Secondary | ICD-10-CM | POA: Diagnosis not present

## 2019-03-29 DIAGNOSIS — Z9181 History of falling: Secondary | ICD-10-CM | POA: Diagnosis not present

## 2019-03-29 DIAGNOSIS — D508 Other iron deficiency anemias: Secondary | ICD-10-CM | POA: Diagnosis not present

## 2019-03-29 DIAGNOSIS — J449 Chronic obstructive pulmonary disease, unspecified: Secondary | ICD-10-CM | POA: Diagnosis not present

## 2019-03-29 DIAGNOSIS — F0281 Dementia in other diseases classified elsewhere with behavioral disturbance: Secondary | ICD-10-CM | POA: Diagnosis not present

## 2019-03-29 DIAGNOSIS — M5416 Radiculopathy, lumbar region: Secondary | ICD-10-CM | POA: Diagnosis not present

## 2019-03-29 DIAGNOSIS — Z0001 Encounter for general adult medical examination with abnormal findings: Secondary | ICD-10-CM | POA: Diagnosis not present

## 2019-03-29 DIAGNOSIS — M5136 Other intervertebral disc degeneration, lumbar region: Secondary | ICD-10-CM | POA: Diagnosis not present

## 2019-03-29 DIAGNOSIS — F329 Major depressive disorder, single episode, unspecified: Secondary | ICD-10-CM | POA: Diagnosis not present

## 2019-03-29 DIAGNOSIS — G309 Alzheimer's disease, unspecified: Secondary | ICD-10-CM | POA: Diagnosis not present

## 2019-03-29 DIAGNOSIS — G300 Alzheimer's disease with early onset: Secondary | ICD-10-CM | POA: Diagnosis not present

## 2019-03-29 DIAGNOSIS — M9903 Segmental and somatic dysfunction of lumbar region: Secondary | ICD-10-CM | POA: Diagnosis not present

## 2019-03-29 DIAGNOSIS — Z720 Tobacco use: Secondary | ICD-10-CM | POA: Diagnosis not present

## 2019-03-29 DIAGNOSIS — M48061 Spinal stenosis, lumbar region without neurogenic claudication: Secondary | ICD-10-CM | POA: Diagnosis not present

## 2019-03-29 DIAGNOSIS — F17210 Nicotine dependence, cigarettes, uncomplicated: Secondary | ICD-10-CM | POA: Diagnosis not present

## 2019-03-29 DIAGNOSIS — G250 Essential tremor: Secondary | ICD-10-CM | POA: Diagnosis not present

## 2019-03-29 DIAGNOSIS — Z950 Presence of cardiac pacemaker: Secondary | ICD-10-CM | POA: Diagnosis not present

## 2019-03-29 DIAGNOSIS — M5126 Other intervertebral disc displacement, lumbar region: Secondary | ICD-10-CM | POA: Diagnosis not present

## 2019-03-29 DIAGNOSIS — M545 Low back pain: Secondary | ICD-10-CM | POA: Diagnosis not present

## 2019-03-29 DIAGNOSIS — Z1231 Encounter for screening mammogram for malignant neoplasm of breast: Secondary | ICD-10-CM | POA: Diagnosis not present

## 2019-03-29 DIAGNOSIS — D649 Anemia, unspecified: Secondary | ICD-10-CM | POA: Diagnosis not present

## 2019-03-29 DIAGNOSIS — Z0000 Encounter for general adult medical examination without abnormal findings: Secondary | ICD-10-CM | POA: Diagnosis not present

## 2019-05-02 DIAGNOSIS — F329 Major depressive disorder, single episode, unspecified: Secondary | ICD-10-CM | POA: Diagnosis not present

## 2019-06-14 DIAGNOSIS — E538 Deficiency of other specified B group vitamins: Secondary | ICD-10-CM | POA: Diagnosis not present

## 2019-06-14 DIAGNOSIS — G588 Other specified mononeuropathies: Secondary | ICD-10-CM | POA: Diagnosis not present

## 2019-06-14 DIAGNOSIS — F329 Major depressive disorder, single episode, unspecified: Secondary | ICD-10-CM | POA: Diagnosis not present

## 2019-06-14 DIAGNOSIS — R413 Other amnesia: Secondary | ICD-10-CM | POA: Diagnosis not present

## 2019-06-14 DIAGNOSIS — Z720 Tobacco use: Secondary | ICD-10-CM | POA: Diagnosis not present

## 2019-06-14 DIAGNOSIS — J449 Chronic obstructive pulmonary disease, unspecified: Secondary | ICD-10-CM | POA: Diagnosis not present

## 2019-08-07 DIAGNOSIS — F329 Major depressive disorder, single episode, unspecified: Secondary | ICD-10-CM | POA: Diagnosis not present

## 2019-08-09 DIAGNOSIS — J449 Chronic obstructive pulmonary disease, unspecified: Secondary | ICD-10-CM | POA: Diagnosis not present

## 2019-08-14 DIAGNOSIS — E538 Deficiency of other specified B group vitamins: Secondary | ICD-10-CM | POA: Diagnosis not present

## 2019-08-14 DIAGNOSIS — J449 Chronic obstructive pulmonary disease, unspecified: Secondary | ICD-10-CM | POA: Diagnosis not present

## 2019-08-14 DIAGNOSIS — Z720 Tobacco use: Secondary | ICD-10-CM | POA: Diagnosis not present

## 2019-08-14 DIAGNOSIS — G588 Other specified mononeuropathies: Secondary | ICD-10-CM | POA: Diagnosis not present

## 2019-08-14 DIAGNOSIS — F329 Major depressive disorder, single episode, unspecified: Secondary | ICD-10-CM | POA: Diagnosis not present

## 2019-08-14 DIAGNOSIS — G250 Essential tremor: Secondary | ICD-10-CM | POA: Diagnosis not present

## 2019-08-23 DIAGNOSIS — R251 Tremor, unspecified: Secondary | ICD-10-CM | POA: Diagnosis not present

## 2019-08-23 DIAGNOSIS — R2681 Unsteadiness on feet: Secondary | ICD-10-CM | POA: Diagnosis not present

## 2019-08-23 DIAGNOSIS — R413 Other amnesia: Secondary | ICD-10-CM | POA: Insufficient documentation

## 2019-10-23 DIAGNOSIS — R2681 Unsteadiness on feet: Secondary | ICD-10-CM | POA: Diagnosis not present

## 2019-10-23 DIAGNOSIS — R251 Tremor, unspecified: Secondary | ICD-10-CM | POA: Diagnosis not present

## 2019-10-23 DIAGNOSIS — R413 Other amnesia: Secondary | ICD-10-CM | POA: Diagnosis not present

## 2019-11-21 ENCOUNTER — Other Ambulatory Visit: Payer: Self-pay

## 2019-11-21 ENCOUNTER — Ambulatory Visit: Payer: PPO | Attending: Neurology | Admitting: Physical Therapy

## 2019-11-21 DIAGNOSIS — Z9181 History of falling: Secondary | ICD-10-CM | POA: Insufficient documentation

## 2019-11-21 DIAGNOSIS — M6281 Muscle weakness (generalized): Secondary | ICD-10-CM | POA: Diagnosis not present

## 2019-11-21 DIAGNOSIS — R2689 Other abnormalities of gait and mobility: Secondary | ICD-10-CM | POA: Diagnosis not present

## 2019-11-21 DIAGNOSIS — R269 Unspecified abnormalities of gait and mobility: Secondary | ICD-10-CM | POA: Diagnosis not present

## 2019-11-22 ENCOUNTER — Encounter: Payer: Self-pay | Admitting: Physical Therapy
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
## 2019-11-26 ENCOUNTER — Ambulatory Visit: Payer: PPO | Admitting: Physical Therapy

## 2019-11-26 DIAGNOSIS — R413 Other amnesia: Secondary | ICD-10-CM | POA: Diagnosis not present

## 2019-11-26 DIAGNOSIS — R2681 Unsteadiness on feet: Secondary | ICD-10-CM | POA: Diagnosis not present

## 2019-11-26 DIAGNOSIS — R251 Tremor, unspecified: Secondary | ICD-10-CM | POA: Diagnosis not present

## 2019-11-28 ENCOUNTER — Encounter: Payer: Self-pay | Admitting: Physical Therapy

## 2019-11-28 ENCOUNTER — Other Ambulatory Visit: Payer: Self-pay

## 2019-11-28 ENCOUNTER — Ambulatory Visit: Payer: PPO | Attending: Neurology | Admitting: Physical Therapy

## 2019-11-28 DIAGNOSIS — Z9181 History of falling: Secondary | ICD-10-CM | POA: Diagnosis not present

## 2019-11-28 DIAGNOSIS — R2689 Other abnormalities of gait and mobility: Secondary | ICD-10-CM

## 2019-11-28 DIAGNOSIS — R269 Unspecified abnormalities of gait and mobility: Secondary | ICD-10-CM | POA: Insufficient documentation

## 2019-11-28 DIAGNOSIS — M6281 Muscle weakness (generalized): Secondary | ICD-10-CM

## 2019-11-28 NOTE — Therapy (Signed)
[facility] [address] [place], [place], [address] Phone: [phone]   Fax:  [phone]  Physical Therapy Treatment  Patient Details  Name: Stanley Jackson MRN: [id number] Date of Birth: 1936/06/14 Referring Provider (PT): Gurney Maxin, MD   Encounter Date: 11/28/2019  PT End of Session - 11/28/19 0859    Visit Number  2    Number of Visits  8    Date for PT Re-Evaluation  12/19/19    Authorization - Visit Number  2    Authorization - Number of Visits  10    PT Start Time  [id number]    PT Stop Time  0943    PT Time Calculation (min)  48 min    Equipment Utilized During Treatment  Gait belt    Activity Tolerance  Patient tolerated treatment well    Behavior During Therapy  [facility] for tasks assessed/performed       History reviewed. No pertinent past medical history.  History reviewed. No pertinent surgical history.  There were no vitals filed for this visit.  Subjective Assessment - 11/28/19 0857    Subjective  Pt. reports no new complaints.  No falls reported.    Pertinent History  Pt. known to PT clinic for balance training in past.  Pt. continues to use [facility] for safety while walking dog and taking car of wife who is primarily bed bound.    Limitations  Standing;Walking;House hold activities    Patient Stated Goals  Decrease falls/ improve walking independence    Currently in Pain?  No/denies        There.ex.:  Scifit L5 10 min. B LE only.  Discussed daily activity and new HEP.  Seated (4#) LAQ/ hip flexion 20x each.  Standing hip ex. (4#): marching/ lateral walking/ heel raises/ knee flexion 20x each.  2 LOB with lateral walking and required UE assist on //-bars to correct.     Neuro.mm.:  Walking in clinic over agility ladder with cuing to increase step/ stride length (CGA/min A for verbal cuing and safety. Walking in //-bars with cone taps/ posture correction with mirror.  Starting with B UE support progressing to no UE  with CGA for safety.   Sit to stands from gray chair with cuing for proper technique.  Tandem stance/ gait (light UE assist required with gait)- benefits from mirror feedback.  Hallway walking (cuing to decrease shuffling/ increase heel strike)- identifying letters/numbers with head turns.     PT Education - 11/28/19 0858    Education provided  Yes    Education Details  See HEP    Person(s) Educated  Patient    Methods  Explanation;Demonstration;Handout    Comprehension  Verbalized understanding;Returned demonstration          PT Long Term Goals - 11/22/19 1845      PT LONG TERM GOAL #1   Title  Pt. will increase Berg balance score to >48/56 to improve balance and decrease fall risk with daily tasks.    Baseline  45/56 on initial evaluation.    Time  4    Period  Weeks    Status  New    Target Date  12/19/19      PT LONG TERM GOAL #2   Title  Pt. will perform 10 sit to stands without use of hands to improve patient's safety within and outside of the home.    Baseline  extra time to stand and requires occasional use  of hands for safety/ control    Time  4    Period  Weeks    Status  New    Target Date  12/19/19      PT LONG TERM GOAL #3   Title  Pt. able to ambulate 5 minutes on level surfaces with appropriate 2-point gait pattern and no falls to improve safety.    Baseline  LOB with head turns/ position changes/ inconsistent use of SPC    Time  4    Period  Weeks    Status  New    Target Date  12/19/19      PT LONG TERM GOAL #4   Title  Pt. will ambulate with more consistent hip flexion/ heel strike/ BOS to improve safety with gait while use of SPC.    Baseline  Pt. ambulates with a shuffling/ staggered gait with short stride length. Pt. has limited heel strike and inconsistent SPC use while walking around PT clinic.    Time  4    Period  Weeks    Status  New    Target Date  12/19/19         Plan - 11/28/19 0859    Clinical Impression Statement  Pt. has a bad  habit of shuffling feet while ambulating on indoor/outdoor surfaces with use of SPC.  Pt. continues to use SPC inconsistently while walking in clinic and able to improve to 2-point gait pattern when cued.  Poor heel strike with gait pattern and forward head posture.  PT focusing on correcting gait pattern/ increasing hip flexion/LE strength to improve safety with gait.    Stability/Clinical Decision Making  Evolving/Moderate complexity    Clinical Decision Making  Moderate    Rehab Potential  Fair    Clinical Impairments Affecting Rehab Potential  hx of falls, personal hx,     PT Frequency  2x / week    PT Duration  4 weeks    PT Treatment/Interventions  ADLs/Self Care Home Management;Aquatic Therapy;Biofeedback;Electrical Stimulation;Moist Heat;DME Instruction;Gait training;Stair training;Functional mobility training;Therapeutic activities;Therapeutic exercise;Balance training;Neuromuscular re-education;Patient/family education;Manual techniques;Passive range of motion;Energy conservation;Taping;Vestibular;Visual/perceptual remediation/compensation    PT Next Visit Plan  Progress static/dynamic balance.  Review HEP    Consulted and Agree with Plan of Care  Patient       Patient will benefit from skilled therapeutic intervention in order to improve the following deficits and impairments:  Abnormal gait, Decreased activity tolerance, Decreased balance, Decreased coordination, Decreased endurance, Decreased mobility, Decreased knowledge of use of DME, Decreased safety awareness, Difficulty walking, Decreased strength, Dizziness, Improper body mechanics, Postural dysfunction  Visit Diagnosis: Gait difficulty  Muscle weakness (generalized)  Balance disorder  Risk for falls     Problem List There are no active problems to display for this patient.  [name], PT, DPT # [id number] 11/28/2019, 9:41 AM  [place] [facility] [address] [place], [place], [address] Phone: [phone]   Fax:  [phone]  Name: Stanley Jackson MRN: [id number] Date of Birth: 1936-10-07

## 2019-11-28 NOTE — Patient Instructions (Signed)
Access Code: [email or web address]  URL: [email or web address]  Date: 11/28/2019  Prepared by: Dorcas Carrow   Exercises  Supine Bridge - 20 reps - 1 sets - 1x daily - 7x weekly  Supine Straight Leg Raises - 20 reps - 1 sets - 1x daily - 7x weekly  Standing Marching - 20 reps - 1 sets - 1x daily - 7x weekly  Tandem Stance with Support - 3 reps - 1 sets - 20 seconds hold - 1x daily - 7x weekly

## 2019-12-03 ENCOUNTER — Ambulatory Visit: Payer: PPO | Admitting: Physical Therapy

## 2019-12-03 ENCOUNTER — Encounter: Payer: Self-pay | Admitting: Physical Therapy

## 2019-12-03 ENCOUNTER — Other Ambulatory Visit: Payer: Self-pay

## 2019-12-03 DIAGNOSIS — R269 Unspecified abnormalities of gait and mobility: Secondary | ICD-10-CM

## 2019-12-03 DIAGNOSIS — Z9181 History of falling: Secondary | ICD-10-CM

## 2019-12-03 DIAGNOSIS — M6281 Muscle weakness (generalized): Secondary | ICD-10-CM

## 2019-12-03 DIAGNOSIS — R2689 Other abnormalities of gait and mobility: Secondary | ICD-10-CM

## 2019-12-03 NOTE — Therapy (Signed)
[facility] [address]. [place], [place], [address] Phone: [phone]   Fax:  [phone]  Physical Therapy Treatment  Patient Details  Name: Stanley Jackson MRN: [id number] Date of Birth: Jan 16, 1936 Referring Provider (PT): Gurney Maxin, MD   Encounter Date: 12/03/2019  PT End of Session - 12/03/19 0902    Visit Number  3    Number of Visits  8    Date for PT Re-Evaluation  12/19/19    Authorization - Visit Number  3    Authorization - Number of Visits  10    PT Start Time  [id number]    PT Stop Time  0947    PT Time Calculation (min)  51 min    Equipment Utilized During Treatment  Gait belt    Activity Tolerance  Patient tolerated treatment well    Behavior During Therapy  [facility] for tasks assessed/performed       History reviewed. No pertinent past medical history.  History reviewed. No pertinent surgical history.  There were no vitals filed for this visit.  Subjective Assessment - 12/03/19 0901    Subjective  Pt. states he had no falls but had an instance of dog pulling leash to chase a cat.  Pt. able to self-correct the LOB while walking dog.    Pertinent History  Pt. known to PT clinic for balance training in past.  Pt. continues to use [facility] for safety while walking dog and taking car of wife who is primarily bed bound.    Limitations  Standing;Walking;House hold activities    Patient Stated Goals  Decrease falls/ improve walking independence    Currently in Pain?  No/denies        Pt. States wife was sick all weekend and only has 1 kidney.     There.ex.:  Scifit L6 10 min. B LE/UE.     Seated (5#) LAQ/ hip flexion/ heel raises 20x each.  Standing hip ex. (5#): marching/ lateral walking/ heel raises/ knee flexion 20x each.  No LOB today.  Light UE assist required with lateral walking to prevent LOB.     Neuro.mm.:  12" step touches (heel/toe)- light to no UE assist (CGA for safety).    Tandem stance/ gait (light UE  assist required with gait)- benefits from mirror feedback.  Walking in //-bars with cone taps/ posture correction with mirror.  Starting with B UE support progressing to no UE with CGA for safety.    Nautilus: walk outs with handle 30# to simulate walking pts. dog (Lucy)  Sit to stands 8x (no UE assist)- multiple attempts/ correction in technique.    Walking in clinic over agility ladder with cuing to increase step/ stride length (CGA/min A for verbal cuing and safety.    Hallway walking (cuing to decrease shuffling/ increase heel strike)- working on consistent step length/ pattern/ heel strike.       PT Long Term Goals - 11/22/19 1845      PT LONG TERM GOAL #1   Title  Pt. will increase Berg balance score to >48/56 to improve balance and decrease fall risk with daily tasks.    Baseline  45/56 on initial evaluation.    Time  4    Period  Weeks    Status  New    Target Date  12/19/19      PT LONG TERM GOAL #2   Title  Pt. will perform 10 sit to stands without use of hands  to improve patient's safety within and outside of the home.    Baseline  extra time to stand and requires occasional use of hands for safety/ control    Time  4    Period  Weeks    Status  New    Target Date  12/19/19      PT LONG TERM GOAL #3   Title  Pt. able to ambulate 5 minutes on level surfaces with appropriate 2-point gait pattern and no falls to improve safety.    Baseline  LOB with head turns/ position changes/ inconsistent use of SPC    Time  4    Period  Weeks    Status  New    Target Date  12/19/19      PT LONG TERM GOAL #4   Title  Pt. will ambulate with more consistent hip flexion/ heel strike/ BOS to improve safety with gait while use of SPC.    Baseline  Pt. ambulates with a shuffling/ staggered gait with short stride length. Pt. has limited heel strike and inconsistent SPC use while walking around PT clinic.    Time  4    Period  Weeks    Status  New    Target Date  12/19/19             Plan - 12/03/19 0902    Clinical Impression Statement  Pt. easily distracted while walking around PT clinic.  Inconsistent step pattern/ heel strike in PT clinic and moderate verbal cuing required to correct.  Pt. had 1 LOB with lateral walking with ankle wts. and able to self-correct with //-bars/ UE assist.  Pt. has limited BOS and arm swing with poor use of SPC.  Pt. tends to carry [facility] in R hand more that using the [facility] during gait pattern.  Forward head/ rounded shoulder posture with most standing/walking tasks.    Stability/Clinical Decision Making  Evolving/Moderate complexity    Clinical Decision Making  Moderate    Rehab Potential  Fair    Clinical Impairments Affecting Rehab Potential  hx of falls, personal hx,     PT Frequency  2x / week    PT Duration  4 weeks    PT Treatment/Interventions  ADLs/Self Care Home Management;Aquatic Therapy;Biofeedback;Electrical Stimulation;Moist Heat;DME Instruction;Gait training;Stair training;Functional mobility training;Therapeutic activities;Therapeutic exercise;Balance training;Neuromuscular re-education;Patient/family education;Manual techniques;Passive range of motion;Energy conservation;Taping;Vestibular;Visual/perceptual remediation/compensation    PT Next Visit Plan  Progress static/dynamic balance.  Review HEP    Consulted and Agree with Plan of Care  Patient       Patient will benefit from skilled therapeutic intervention in order to improve the following deficits and impairments:  Abnormal gait, Decreased activity tolerance, Decreased balance, Decreased coordination, Decreased endurance, Decreased mobility, Decreased knowledge of use of DME, Decreased safety awareness, Difficulty walking, Decreased strength, Dizziness, Improper body mechanics, Postural dysfunction  Visit Diagnosis: Gait difficulty  Muscle weakness (generalized)  Balance disorder  Risk for falls     Problem List There are no active problems to display  for this patient.  [name], PT, DPT # [id number] 12/03/2019, 12:47 PM  [facility] [address] [place], [place], [address] Phone: [phone]   Fax:  [phone]  Name: Stanley Jackson MRN: [id number] Date of Birth: [birth year]

## 2019-12-05 ENCOUNTER — Encounter: Payer: PPO | Admitting: Physical Therapy

## 2019-12-07 DIAGNOSIS — J449 Chronic obstructive pulmonary disease, unspecified: Secondary | ICD-10-CM | POA: Diagnosis not present

## 2019-12-10 ENCOUNTER — Encounter: Payer: PPO | Admitting: Physical Therapy

## 2019-12-11 DIAGNOSIS — R251 Tremor, unspecified: Secondary | ICD-10-CM | POA: Diagnosis not present

## 2019-12-11 DIAGNOSIS — R2681 Unsteadiness on feet: Secondary | ICD-10-CM | POA: Diagnosis not present

## 2019-12-11 DIAGNOSIS — R413 Other amnesia: Secondary | ICD-10-CM | POA: Diagnosis not present

## 2019-12-12 ENCOUNTER — Ambulatory Visit: Payer: PPO | Admitting: Physical Therapy

## 2019-12-17 ENCOUNTER — Encounter: Payer: PPO | Admitting: Physical Therapy

## 2019-12-19 ENCOUNTER — Encounter: Payer: PPO | Admitting: Physical Therapy

## 2019-12-24 ENCOUNTER — Encounter: Payer: PPO | Admitting: Physical Therapy

## 2019-12-25 DIAGNOSIS — J329 Chronic sinusitis, unspecified: Secondary | ICD-10-CM | POA: Diagnosis not present

## 2019-12-25 DIAGNOSIS — E538 Deficiency of other specified B group vitamins: Secondary | ICD-10-CM | POA: Diagnosis not present

## 2019-12-25 DIAGNOSIS — J449 Chronic obstructive pulmonary disease, unspecified: Secondary | ICD-10-CM | POA: Diagnosis not present

## 2019-12-25 DIAGNOSIS — F329 Major depressive disorder, single episode, unspecified: Secondary | ICD-10-CM | POA: Diagnosis not present

## 2019-12-25 DIAGNOSIS — G588 Other specified mononeuropathies: Secondary | ICD-10-CM | POA: Diagnosis not present

## 2019-12-25 DIAGNOSIS — G250 Essential tremor: Secondary | ICD-10-CM | POA: Diagnosis not present

## 2019-12-26 ENCOUNTER — Encounter: Payer: PPO | Admitting: Physical Therapy

## 2020-01-02 ENCOUNTER — Ambulatory Visit: Payer: PPO | Attending: Neurology | Admitting: Physical Therapy

## 2020-01-03 ENCOUNTER — Other Ambulatory Visit: Payer: Self-pay | Admitting: Neurology

## 2020-01-03 DIAGNOSIS — R413 Other amnesia: Secondary | ICD-10-CM

## 2020-01-04 ENCOUNTER — Ambulatory Visit: Payer: PPO | Admitting: Physical Therapy

## 2020-01-04 ENCOUNTER — Other Ambulatory Visit: Payer: Self-pay

## 2020-01-06 ENCOUNTER — Encounter: Payer: Self-pay | Admitting: Physical Therapy

## 2020-01-09 ENCOUNTER — Encounter: Payer: PPO | Admitting: Physical Therapy

## 2020-01-11 ENCOUNTER — Ambulatory Visit
Admission: RE | Admit: 2020-01-11 | Discharge: 2020-01-11 | Disposition: A | Discharge status: Home / Self Care | Payer: PPO | Source: Ambulatory Visit | Attending: Neurology | Admitting: Neurology

## 2020-01-11 ENCOUNTER — Encounter: Payer: PPO | Admitting: Physical Therapy

## 2020-01-11 ENCOUNTER — Other Ambulatory Visit: Payer: Self-pay

## 2020-01-11 DIAGNOSIS — R413 Other amnesia: Secondary | ICD-10-CM | POA: Diagnosis not present

## 2020-01-14 ENCOUNTER — Ambulatory Visit: Payer: PPO | Admitting: Physical Therapy

## 2020-01-15 DIAGNOSIS — R4586 Emotional lability: Secondary | ICD-10-CM | POA: Diagnosis not present

## 2020-01-15 DIAGNOSIS — E538 Deficiency of other specified B group vitamins: Secondary | ICD-10-CM | POA: Diagnosis not present

## 2020-01-15 DIAGNOSIS — R251 Tremor, unspecified: Secondary | ICD-10-CM | POA: Diagnosis not present

## 2020-01-15 DIAGNOSIS — R413 Other amnesia: Secondary | ICD-10-CM | POA: Diagnosis not present

## 2020-01-15 DIAGNOSIS — R2681 Unsteadiness on feet: Secondary | ICD-10-CM | POA: Diagnosis not present

## 2020-01-16 ENCOUNTER — Encounter: Payer: PPO | Admitting: Physical Therapy

## 2020-01-17 ENCOUNTER — Ambulatory Visit: Payer: PPO | Admitting: Physical Therapy

## 2020-01-18 ENCOUNTER — Encounter: Payer: PPO | Admitting: Physical Therapy

## 2020-01-21 DIAGNOSIS — R4586 Emotional lability: Secondary | ICD-10-CM | POA: Insufficient documentation

## 2020-01-22 ENCOUNTER — Emergency Department: Payer: PPO

## 2020-01-22 ENCOUNTER — Emergency Department
Admission: EM | Admit: 2020-01-22 | Discharge: 2020-01-22 | Disposition: A | Discharge status: Home / Self Care | Payer: PPO | Attending: Emergency Medicine | Admitting: Emergency Medicine

## 2020-01-22 ENCOUNTER — Encounter: Payer: Self-pay | Admitting: Emergency Medicine

## 2020-01-22 ENCOUNTER — Other Ambulatory Visit: Payer: Self-pay

## 2020-01-22 DIAGNOSIS — S59912A Unspecified injury of left forearm, initial encounter: Secondary | ICD-10-CM | POA: Diagnosis present

## 2020-01-22 DIAGNOSIS — F0390 Unspecified dementia without behavioral disturbance: Secondary | ICD-10-CM | POA: Insufficient documentation

## 2020-01-22 DIAGNOSIS — F17210 Nicotine dependence, cigarettes, uncomplicated: Secondary | ICD-10-CM | POA: Diagnosis not present

## 2020-01-22 DIAGNOSIS — W01198A Fall on same level from slipping, tripping and stumbling with subsequent striking against other object, initial encounter: Secondary | ICD-10-CM | POA: Diagnosis not present

## 2020-01-22 DIAGNOSIS — S51812A Laceration without foreign body of left forearm, initial encounter: Secondary | ICD-10-CM

## 2020-01-22 DIAGNOSIS — Y93K1 Activity, walking an animal: Secondary | ICD-10-CM | POA: Insufficient documentation

## 2020-01-22 DIAGNOSIS — W19XXXA Unspecified fall, initial encounter: Secondary | ICD-10-CM

## 2020-01-22 DIAGNOSIS — S069X9A Unspecified intracranial injury with loss of consciousness of unspecified duration, initial encounter: Secondary | ICD-10-CM | POA: Diagnosis not present

## 2020-01-22 DIAGNOSIS — Y92009 Unspecified place in unspecified non-institutional (private) residence as the place of occurrence of the external cause: Secondary | ICD-10-CM

## 2020-01-22 DIAGNOSIS — Y998 Other external cause status: Secondary | ICD-10-CM | POA: Diagnosis not present

## 2020-01-22 DIAGNOSIS — Z79899 Other long term (current) drug therapy: Secondary | ICD-10-CM | POA: Insufficient documentation

## 2020-01-22 DIAGNOSIS — Y9289 Other specified places as the place of occurrence of the external cause: Secondary | ICD-10-CM | POA: Insufficient documentation

## 2020-01-22 NOTE — ED Provider Notes (Signed)
[facility] Emergency Department Provider Note ____________________________________________  Time seen: 1717  I have reviewed the triage vital signs and the nursing notes.  HISTORY  Chief Complaint  Arm Pain and Fall  HPI Stanley Jackson is a 84 y.o. male presents to the ED for evaluation injuries following a mechanical fall. The patient apparently fell last night, while walking the dog.  A neighbor witnessed the fall from their window, and came out to have the patient up.  He apparently sustained a skin tear to his left forearm.  Patient has a history of early dementia, and does not recall the actual fall.  He was evaluated at [facility] today along with his wife, and was recommended that he report to the ED for further evaluation including CT imaging of the head.  Patient is without complaints at this time, with the exception of a skin tear to the left forearm.  According to the wife, the patient is at his baseline for activity and cognition at this time.   History reviewed. No pertinent past medical history.  There are no problems to display for this patient.  History reviewed. No pertinent surgical history.  Prior to Admission medications   Medication Sig Start Date End Date Taking? Authorizing Provider  donepezil (ARICEPT) 5 MG tablet Take 5 mg by mouth at bedtime.   Yes [provider]  gabapentin (NEURONTIN) 100 MG capsule Take 100 mg by mouth 3 (three) times daily.   Yes [provider]  QUEtiapine (SEROQUEL) 25 MG tablet Take 25 mg by mouth at bedtime.   Yes [provider]  sertraline (ZOLOFT) 50 MG tablet Take 50 mg by mouth daily.   Yes [provider]  umeclidinium-vilanterol (ANORO ELLIPTA) 62.5-25 MCG/INH AEPB Inhale 1 puff into the lungs daily.   Yes [provider]  DimenhyDRINATE (DRAMAMINE PO) Take by mouth. Takes at night    [provider]    Allergies Patient has no known allergies.  History  reviewed. No pertinent family history.  Social History Social History   Tobacco Use  . Smoking status: Current Every Day Smoker    Packs/day: 0.50    Types: Cigarettes  . Smokeless tobacco: Never Used  Substance Use Topics  . Alcohol use: No    Comment: quit a few years ago  . Drug use: Never    Review of Systems  Constitutional: Negative for fever. Eyes: Negative for visual changes. ENT: Negative for sore throat. Cardiovascular: Negative for chest pain. Respiratory: Negative for shortness of breath. Gastrointestinal: Negative for abdominal pain, vomiting and diarrhea. Genitourinary: Negative for dysuria. Musculoskeletal: Negative for back pain. Skin: Negative for rash.  Skin tear to the LUE Neurological: Negative for headaches, focal weakness or numbness. ____________________________________________  PHYSICAL EXAM:  VITAL SIGNS: ED Triage Vitals  Enc Vitals Group     BP 01/22/20 1653 90/69     Pulse Rate 01/22/20 1653 (!) 56     Resp 01/22/20 1653 16     Temp 01/22/20 1653 98 F (36.7 C)     Temp Source 01/22/20 1653 Oral     SpO2 01/22/20 1653 98 %     Weight 01/22/20 1656 137 lb (62.1 kg)     Height 01/22/20 1656 5\' 10"  (1.778 m)     Head Circumference --      Peak Flow --      Pain Score 01/22/20 1655 0     Pain Loc --      Pain Edu? --  Excl. in [place]? --     Constitutional: Alert and oriented. Well appearing and in no distress. Head: Normocephalic and atraumatic. Eyes: Conjunctivae are normal. Normal extraocular movements Neck: Supple.  Normal range of motion without crepitus.  No distracting midline tenderness is noted. Cardiovascular: Normal rate, regular rhythm. Normal distal pulses. Respiratory: Normal respiratory effort. No wheezes/rales/rhonchi. Gastrointestinal: Soft and nontender. No distention. Musculoskeletal: Nontender with normal range of motion in all extremities.  Neurologic:  Normal gait without ataxia. Normal speech and language. No  gross focal neurologic deficits are appreciated. Skin:  Skin is warm, dry and intact. No rash noted.  Superficial skin tears to the left forearm.  No active bleeding is appreciated.  No signs of infection noted.  Bleeding is controlled at this time. Psychiatric: Mood and affect are normal. Patient exhibits appropriate insight and judgment. ____________________________________________   RADIOLOGY  CT head without CM IMPRESSION: 1. No acute intracranial abnormality or acute traumatic injury identified. 2. Stable compared to the MRI earlier this month.  ____________________________________________  PROCEDURES  Procedures  Wound care: petroleum gauze/Telfa/Stocking dressing ____________________________________________  INITIAL IMPRESSION / ASSESSMENT AND PLAN / ED COURSE  Geriatric patient with ED evaluation of injury sustained following a mechanical fall.  Patient with a history of dementia and ataxia, presents after fall last night.  No complaints per the patient.  His CT is negative and reassuring at this time.  His superficial skin tear on the left upper extremity is dressed after local wound care.  Patient is discharged to the care of his wife with discharge instructions and wound care supplies.  They will follow with primary provider as needed.  [name] was evaluated in Emergency Department on 01/22/2020 for the symptoms described in the history of present illness. He was evaluated in the context of the global COVID-19 pandemic, which necessitated consideration that the patient might be at risk for infection with the SARS-CoV-2 virus that causes COVID-19. Institutional protocols and algorithms that pertain to the evaluation of patients at risk for COVID-19 are in a state of rapid change based on information released by regulatory bodies including the CDC and federal and state organizations. These policies and algorithms were followed during the patient's care in the  ED. ____________________________________________  FINAL CLINICAL IMPRESSION(S) / ED DIAGNOSES  Final diagnoses:  Fall at home, initial encounter  Skin tear of forearm without complication, left, initial encounter      Melvenia Needles, PA-C 01/22/20 1909    Vanessa , MD 01/22/20 2253

## 2020-01-22 NOTE — ED Notes (Signed)
This RN spoke with patients wife; wife reports mechanical fall last night while taking the dog out, denies LOC.  States another gentleman helped him up, pulling on arms and causing skin tear to left arm.

## 2020-01-22 NOTE — ED Triage Notes (Addendum)
Pt in via POV, reports falling yesterday with skin tear to left arm, bandage in place upon arrival.  Denies any complaints but is unable to recall the fall yesterday, LOC unknown at time of fall.  Pt A/Ox4, no neuro deficits noted at this time.  This RN attempted to reach out to pt's wife who brought him here but was unable to reach her at this time.

## 2020-01-22 NOTE — Discharge Instructions (Addendum)
Your exam and CT scan are normal following your fall. Keep the skin tear clean, dry, and covered. Follow-up with Dr. Edwina Barth as needed.

## 2020-01-23 ENCOUNTER — Encounter: Payer: PPO | Admitting: Physical Therapy

## 2020-01-25 ENCOUNTER — Encounter: Payer: PPO | Admitting: Physical Therapy

## 2020-01-30 ENCOUNTER — Encounter: Payer: PPO | Admitting: Physical Therapy

## 2020-02-01 ENCOUNTER — Encounter: Payer: PPO | Admitting: Physical Therapy

## 2020-02-18 DIAGNOSIS — E538 Deficiency of other specified B group vitamins: Secondary | ICD-10-CM | POA: Diagnosis not present

## 2020-02-21 DIAGNOSIS — Z7289 Other problems related to lifestyle: Secondary | ICD-10-CM | POA: Diagnosis not present

## 2020-02-21 DIAGNOSIS — Z114 Encounter for screening for human immunodeficiency virus [HIV]: Secondary | ICD-10-CM | POA: Diagnosis not present

## 2020-02-21 DIAGNOSIS — F0281 Dementia in other diseases classified elsewhere with behavioral disturbance: Secondary | ICD-10-CM | POA: Diagnosis not present

## 2020-02-21 DIAGNOSIS — Z1159 Encounter for screening for other viral diseases: Secondary | ICD-10-CM | POA: Diagnosis not present

## 2020-02-21 DIAGNOSIS — G309 Alzheimer's disease, unspecified: Secondary | ICD-10-CM | POA: Diagnosis not present

## 2020-02-21 DIAGNOSIS — R1084 Generalized abdominal pain: Secondary | ICD-10-CM | POA: Diagnosis not present

## 2020-02-21 DIAGNOSIS — Z9189 Other specified personal risk factors, not elsewhere classified: Secondary | ICD-10-CM | POA: Diagnosis not present

## 2020-03-05 DIAGNOSIS — I700 Atherosclerosis of aorta: Secondary | ICD-10-CM | POA: Diagnosis not present

## 2020-03-05 DIAGNOSIS — R1084 Generalized abdominal pain: Secondary | ICD-10-CM | POA: Diagnosis not present

## 2020-03-05 DIAGNOSIS — K5730 Diverticulosis of large intestine without perforation or abscess without bleeding: Secondary | ICD-10-CM | POA: Diagnosis not present

## 2020-03-05 DIAGNOSIS — K551 Chronic vascular disorders of intestine: Secondary | ICD-10-CM | POA: Diagnosis not present

## 2020-03-05 DIAGNOSIS — K8689 Other specified diseases of pancreas: Secondary | ICD-10-CM | POA: Diagnosis not present

## 2020-03-06 DIAGNOSIS — J449 Chronic obstructive pulmonary disease, unspecified: Secondary | ICD-10-CM | POA: Diagnosis not present

## 2020-03-06 DIAGNOSIS — F17218 Nicotine dependence, cigarettes, with other nicotine-induced disorders: Secondary | ICD-10-CM | POA: Diagnosis not present

## 2020-03-06 DIAGNOSIS — R0600 Dyspnea, unspecified: Secondary | ICD-10-CM | POA: Diagnosis not present

## 2020-03-18 DIAGNOSIS — F329 Major depressive disorder, single episode, unspecified: Secondary | ICD-10-CM | POA: Diagnosis not present

## 2020-03-18 DIAGNOSIS — G309 Alzheimer's disease, unspecified: Secondary | ICD-10-CM | POA: Diagnosis not present

## 2020-03-18 DIAGNOSIS — F0281 Dementia in other diseases classified elsewhere with behavioral disturbance: Secondary | ICD-10-CM | POA: Diagnosis not present

## 2020-03-19 ENCOUNTER — Encounter: Payer: Self-pay | Admitting: Physical Therapy

## 2020-04-02 ENCOUNTER — Ambulatory Visit: Payer: PPO | Attending: Neurology | Admitting: Physical Therapy

## 2020-04-02 ENCOUNTER — Encounter: Payer: Self-pay | Admitting: Physical Therapy

## 2020-04-02 ENCOUNTER — Other Ambulatory Visit: Payer: Self-pay

## 2020-04-02 DIAGNOSIS — R269 Unspecified abnormalities of gait and mobility: Secondary | ICD-10-CM

## 2020-04-02 DIAGNOSIS — M6281 Muscle weakness (generalized): Secondary | ICD-10-CM | POA: Diagnosis not present

## 2020-04-02 DIAGNOSIS — Z9181 History of falling: Secondary | ICD-10-CM

## 2020-04-02 DIAGNOSIS — R2689 Other abnormalities of gait and mobility: Secondary | ICD-10-CM

## 2020-04-03 NOTE — Therapy (Signed)
[place] [facility] [address] [place], [place], [address] Phone: [phone]   Fax:  [phone]  Physical Therapy Evaluation  Patient Details  Name: Stanley Jackson MRN: [id number] Date of Birth: 09/12/1936 Referring Provider (PT): Delray Alt, MD   Encounter Date: 04/02/2020  PT End of Session - 04/02/20 1303    Visit Number  1    Number of Visits  9    Date for PT Re-Evaluation  04/30/20    Authorization - Visit Number  1    Authorization - Number of Visits  10    PT Start Time  [id number]    PT Stop Time  [id number]    PT Time Calculation (min)  53 min    Equipment Utilized During Treatment  Gait belt    Activity Tolerance  Patient tolerated treatment well    Behavior During Therapy  [facility] for tasks assessed/performed       History reviewed. No pertinent past medical history.  History reviewed. No pertinent surgical history.  There were no vitals filed for this visit.   Subjective Assessment - 04/02/20 1301    Subjective  Pt. states he was walking down the sidewalk and fell in the grass about 6 weeks ago (no injury).  No pain reported at this time.  Pt. started PT at end of last year but limited by family situation and had to put PT on hold.  Pt. has had several falls over past 3 months.    Pertinent History  Pt. known to PT clinic for balance training in past.  Pt. continues to use [facility] for safety while walking dog and taking car of wife who is primarily bed bound.    Limitations  Standing;Walking;House hold activities    Patient Stated Goals  Decrease falls/ improve walking independence    Currently in Pain?  No/denies         [facility] PT Assessment - 04/03/20 0001      Assessment   Medical Diagnosis  Unsteady Gait    Referring Provider (PT)  Delray Alt, MD    Onset Date/Surgical Date  02/02/16    Prior Therapy  yes, known to PT clinic.        Precautions   Precautions  Fall    Precaution Comments  frequent falls      Balance Screen    Has the patient fallen in the past 6 months  Yes    How many times?  6+      [place] residence      Prior Function   Level of Independence  Requires assistive device for independence      Cognition   Overall Cognitive Status  History of cognitive impairments - at baseline    Memory  Impaired        See flowsheet  B LE muscle strength grossly 5/5 except hip flexion 4/5 MMT Shuffling gait pattern with inconsistent use of SPC R knee pain from old injury    Objective measurements completed on examination: See above findings.     Gait training:  Pt. Instructed in use of RW and proper sizing/ posture/ step pattern. Marked decrease in shuffling gait/ increase hip flexion/ heel strike with use of RW. Improvement in maneuvering curb/ ramps outside.      PT Long Term Goals - 04/03/20 2012      PT LONG TERM GOAL #1   Title  Pt. will increase Merrilee Jansky  balance score to >48/56 to improve balance and decrease fall risk with daily tasks.    Baseline  43/56 on initial evaluation.  High fall risk.    Time  4    Period  Weeks    Status  New    Target Date  04/29/20      PT LONG TERM GOAL #2   Title  Pt. will perform 10 sit to stands without use of hands to improve patient's safety within and outside of the home.    Baseline  extra time to stand and requires occasional use of hands for safety/ control    Time  4    Period  Weeks    Status  New    Target Date  04/29/20      PT LONG TERM GOAL #3   Title  Pt. able to ambulate 5 minutes on level surfaces with appropriate 2-point gait pattern and no falls to improve safety.    Baseline  LOB with head turns/ position changes/ inconsistent use of SPC.  Pt. educated on use of RW/ benefits    Time  4    Period  Weeks    Status  New    Target Date  04/29/20      PT LONG TERM GOAL #4   Title  Pt. will ambulate with more consistent hip flexion/ heel strike/ BOS to improve safety with gait while use of  SPC.    Baseline  Pt. ambulates with a shuffling/ staggered gait with short stride length. Pt. has limited heel strike and inconsistent SPC use while walking around PT clinic.    Time  4    Period  Weeks    Status  New    Target Date  04/29/20          Plan - 04/02/20 1304    Clinical Impression Statement  Pt. is a pleasant 84 year old male who presents to his physical therapy evaluation for recurrent falls/ gait difficulty. Pt. reports no pain at rest but increase R knee discomfort with walking/ step ups due to old injury.  Pt. presents with good B LE muscle strength but Berg balance test of 43/56.  PT discussed benefits of using RW to decrease fall risk/ improve gait pattern/ upright posture.  Pt. states he has several RW but does not use them.  Pt. entered PT with inconsistent use of SPC.  No c/o dizziness during PT evaluation but h/o dizziness and tremors noted in chart. Pt. ambulates with a shuffling/ staggered gait with short stride length. Pt. has limited heel strike/ arm swing/ BOS noted during gait in PT hallway.  Pt. would benefit from skilled physical therapy to improve balance/ decrease fall risk and improve safety in daily activities/ walking.    Stability/Clinical Decision Making  Evolving/Moderate complexity    Clinical Decision Making  Moderate    Rehab Potential  Fair    Clinical Impairments Affecting Rehab Potential  hx of falls, personal hx,     PT Frequency  2x / week    PT Duration  4 weeks    PT Treatment/Interventions  ADLs/Self Care Home Management;Aquatic Therapy;Biofeedback;Electrical Stimulation;Moist Heat;DME Instruction;Gait training;Stair training;Functional mobility training;Therapeutic activities;Therapeutic exercise;Balance training;Neuromuscular re-education;Patient/family education;Manual techniques;Passive range of motion;Energy conservation;Taping;Vestibular;Visual/perceptual remediation/compensation    PT Next Visit Plan  Progress static/dynamic balance.   Review HEP    Consulted and Agree with Plan of Care  Patient;Family member/caregiver       Patient will benefit from skilled therapeutic intervention  in order to improve the following deficits and impairments:  Abnormal gait, Decreased activity tolerance, Decreased balance, Decreased coordination, Decreased endurance, Decreased mobility, Decreased knowledge of use of DME, Decreased safety awareness, Difficulty walking, Decreased strength, Dizziness, Improper body mechanics, Postural dysfunction  Visit Diagnosis: Gait difficulty  Muscle weakness (generalized)  Balance disorder  Risk for falls     Problem List There are no problems to display for this patient.  [name], PT, DPT # [phone] 04/03/2020, 8:15 PM  [facility] [address] [place], [place], [address] Phone: [phone]   Fax:  [phone]  Name: Stanley Jackson MRN: [id number] Date of Birth: 20-May-1936

## 2020-04-07 NOTE — Addendum Note (Signed)
Addended by: [name] on: 04/07/2020 10:28 AM   Modules accepted: Orders

## 2020-04-09 ENCOUNTER — Ambulatory Visit: Payer: PPO | Admitting: Physical Therapy

## 2020-04-14 ENCOUNTER — Encounter: Payer: Self-pay | Admitting: Physical Therapy

## 2020-04-15 ENCOUNTER — Telehealth: Payer: Self-pay

## 2020-04-15 NOTE — Telephone Encounter (Signed)
Telephone call to patient's home, spoke with patient's wife Diane.  Diane in agreement with palliative care team making home visit 04-22-20 at 1:00 PM.

## 2020-04-16 ENCOUNTER — Ambulatory Visit: Payer: PPO | Admitting: Physical Therapy

## 2020-04-16 ENCOUNTER — Encounter: Payer: Self-pay | Admitting: Physical Therapy

## 2020-04-16 ENCOUNTER — Other Ambulatory Visit: Payer: Self-pay

## 2020-04-16 DIAGNOSIS — R2689 Other abnormalities of gait and mobility: Secondary | ICD-10-CM

## 2020-04-16 DIAGNOSIS — M6281 Muscle weakness (generalized): Secondary | ICD-10-CM

## 2020-04-16 DIAGNOSIS — R269 Unspecified abnormalities of gait and mobility: Secondary | ICD-10-CM | POA: Diagnosis not present

## 2020-04-16 DIAGNOSIS — Z9181 History of falling: Secondary | ICD-10-CM

## 2020-04-16 NOTE — Therapy (Signed)
[facility] [address] [place], [place], [address] Phone: [phone]   Fax:  [phone]  Physical Therapy Treatment  Patient Details  Name: Stanley Jackson MRN: [id number] Date of Birth: [birth year] Referring Provider (PT): Delray Alt, MD   Encounter Date: 04/16/2020  PT End of Session - 04/16/20 1125    Visit Number  2    Number of Visits  9    Date for PT Re-Evaluation  04/30/20    Authorization - Visit Number  2    Authorization - Number of Visits  10    PT Start Time  [id number]    PT Stop Time  1202    PT Time Calculation (min)  48 min    Equipment Utilized During Treatment  Gait belt    Activity Tolerance  Patient tolerated treatment well    Behavior During Therapy  [facility] for tasks assessed/performed       History reviewed. No pertinent past medical history.  History reviewed. No pertinent surgical history.  There were no vitals filed for this visit.  Subjective Assessment - 04/16/20 1122    Subjective  Pt. states he missed last PT tx. session because wife fell and was hurting/ sore.  Pt. reports he has not fallen and took dog Forensic psychologist) for walk this morning.  No c/o pain but reports legs feel tired.    Pertinent History  Pt. known to PT clinic for balance training in past.  Pt. continues to use [facility] for safety while walking dog and taking car of wife who is primarily bed bound.    Limitations  Standing;Walking;House hold activities    Patient Stated Goals  Decrease falls/ improve walking independence    Currently in Pain?  No/denies       There.ex.:  Standing marching/ lateral walking 20x each in //-bars (no UE assist/ mirror feedback for posture). Nustep L5 10 min. B UE/LE Reviewed HEP   Neuro:  Hallway activities: high marching/ alt. UE and LE touches/ step over green hurdles and 3" plinths.  Tandem stance/ gait in //-bars with CGA/min. A (starting with light UE assist to no UE) Sit to stands with proper upright  posture/ technique 10x Walking outside: grassy terrain/ curbs/ parking lot walking with moderate cuing to increase hip flexion/ heel strike. Pt. Tends to decrease stride length when changing terrain/ thresholds    PT Long Term Goals - 04/03/20 2012      PT LONG TERM GOAL #1   Title  Pt. will increase Berg balance score to >48/56 to improve balance and decrease fall risk with daily tasks.    Baseline  43/56 on initial evaluation.  High fall risk.    Time  4    Period  Weeks    Status  New    Target Date  04/29/20      PT LONG TERM GOAL #2   Title  Pt. will perform 10 sit to stands without use of hands to improve patient's safety within and outside of the home.    Baseline  extra time to stand and requires occasional use of hands for safety/ control    Time  4    Period  Weeks    Status  New    Target Date  04/29/20      PT LONG TERM GOAL #3   Title  Pt. able to ambulate 5 minutes on level surfaces with appropriate 2-point gait pattern and no falls to improve safety.  Baseline  LOB with head turns/ position changes/ inconsistent use of SPC.  Pt. educated on use of RW/ benefits    Time  4    Period  Weeks    Status  New    Target Date  04/29/20      PT LONG TERM GOAL #4   Title  Pt. will ambulate with more consistent hip flexion/ heel strike/ BOS to improve safety with gait while use of SPC.    Baseline  Pt. ambulates with a shuffling/ staggered gait with short stride length. Pt. has limited heel strike and inconsistent SPC use while walking around PT clinic.    Time  4    Period  Weeks    Status  New    Target Date  04/29/20         Plan - 04/16/20 1125    Clinical Impression Statement  Pt. had several LOB while walking in hallway working on step overs/ ups with use of SPC requires mod. A to correct.  Pt. had difficulty with larger step overs (green hurdles) in hallway.  Pt. inconsistently uses SPC and will benefit from use of RW but somewhat resistant.  Pt. has 3 RW at  home but states they are cumbersome to use.  PT discussed pts. walking/balance with wife to ensure carryover and safety at home.  No change to HEP.    Stability/Clinical Decision Making  Evolving/Moderate complexity    Clinical Decision Making  Moderate    Rehab Potential  Fair    Clinical Impairments Affecting Rehab Potential  hx of falls, personal hx,     PT Frequency  2x / week    PT Duration  4 weeks    PT Treatment/Interventions  ADLs/Self Care Home Management;Aquatic Therapy;Biofeedback;Electrical Stimulation;Moist Heat;DME Instruction;Gait training;Stair training;Functional mobility training;Therapeutic activities;Therapeutic exercise;Balance training;Neuromuscular re-education;Patient/family education;Manual techniques;Passive range of motion;Energy conservation;Taping;Vestibular;Visual/perceptual remediation/compensation    PT Next Visit Plan  Progress static/dynamic balance.    Consulted and Agree with Plan of Care  Patient;Family member/caregiver       Patient will benefit from skilled therapeutic intervention in order to improve the following deficits and impairments:  Abnormal gait, Decreased activity tolerance, Decreased balance, Decreased coordination, Decreased endurance, Decreased mobility, Decreased knowledge of use of DME, Decreased safety awareness, Difficulty walking, Decreased strength, Dizziness, Improper body mechanics, Postural dysfunction  Visit Diagnosis: Gait difficulty  Muscle weakness (generalized)  Balance disorder  Risk for falls     Problem List There are no problems to display for this patient.  [name], PT, DPT # [phone] 04/16/2020, 12:29 PM  [place] [facility] [address] [place], [place], [address] Phone: [phone]   Fax:  [phone]  Name: Stanley Jackson MRN: [id number] Date of Birth: July 25, 1936

## 2020-04-21 ENCOUNTER — Telehealth: Payer: Self-pay

## 2020-04-21 ENCOUNTER — Other Ambulatory Visit: Payer: Self-pay

## 2020-04-21 ENCOUNTER — Ambulatory Visit: Payer: PPO | Admitting: Physical Therapy

## 2020-04-21 ENCOUNTER — Encounter: Payer: Self-pay | Admitting: Physical Therapy

## 2020-04-21 DIAGNOSIS — R2689 Other abnormalities of gait and mobility: Secondary | ICD-10-CM

## 2020-04-21 DIAGNOSIS — M6281 Muscle weakness (generalized): Secondary | ICD-10-CM

## 2020-04-21 DIAGNOSIS — R269 Unspecified abnormalities of gait and mobility: Secondary | ICD-10-CM | POA: Diagnosis not present

## 2020-04-21 DIAGNOSIS — Z9181 History of falling: Secondary | ICD-10-CM

## 2020-04-21 NOTE — Telephone Encounter (Signed)
Telephone call from wife requesting to cancel palliative care appointment for tomorrow.  Wife asks who made referral for patient.  RN informs wife Dr Quita Skye made referral.  Wife states she does not know who Dr Quita Skye is and states she does not want palliative care for patient unless Dr Donnal Debar makes referral. RN called and updated Rhett Bannister.  Stanton Kidney states she thinks Dr Quita Skye was covering for Dr Donnal Debar.  Mary to clarify and update RN.

## 2020-04-21 NOTE — Therapy (Signed)
[facility] [address] [place], [place], [address] Phone: [phone]   Fax:  [phone]  Physical Therapy Treatment  Patient Details  Name: Stanley Jackson MRN: [id number] Date of Birth: Sep 20, 1936 Referring Provider (PT): Delray Alt, MD   Encounter Date: 04/21/2020  PT End of Session - 04/21/20 1245    Visit Number  3    Number of Visits  9    Date for PT Re-Evaluation  04/30/20    Authorization - Visit Number  3    Authorization - Number of Visits  10    PT Start Time  [id number]    PT Stop Time  [id number]    PT Time Calculation (min)  48 min    Equipment Utilized During Treatment  Gait belt    Activity Tolerance  Patient tolerated treatment well    Behavior During Therapy  [facility] for tasks assessed/performed       History reviewed. No pertinent past medical history.  History reviewed. No pertinent surgical history.  There were no vitals filed for this visit.  Subjective Assessment - 04/21/20 1245    Subjective  Pt. reports no recent falls.  Pt. entered PT with use of SPC.  Pt. states he went for a 1.5 mile walk this morning while walking dog.    Pertinent History  Pt. known to PT clinic for balance training in past.  Pt. continues to use [facility] for safety while walking dog and taking car of wife who is primarily bed bound.    Limitations  Standing;Walking;House hold activities    Patient Stated Goals  Decrease falls/ improve walking independence    Currently in Pain?  No/denies         There.ex.:  Nustep L5 10 min. B UE/LE Standing LE ex. (4#): marching/ heel raises/ walking over green hurdles/ lateral walking 20x each in //-bars (min. To no UE assist/ mirror feedback for posture). Seated 4# LAQ/ marching 20x each.  Cuing for posture correction.  Walking in hallway with ankle wts and SPC (2 LOB to R with pt. Able to self-correct during CGA).     Neuro:  Hallway activities: high marching/ alt. UE and LE touches/ step over and up  3" plinths.  Sit to stands (7x) with no UE assist and proper upright posture/ technique 10x Walking outside: grassy terrain/ curbs/ parking lot walking with moderate cuing to increase hip flexion/ heel strike. CGA for safety.      PT Long Term Goals - 04/03/20 2012      PT LONG TERM GOAL #1   Title  Pt. will increase Berg balance score to >48/56 to improve balance and decrease fall risk with daily tasks.    Baseline  43/56 on initial evaluation.  High fall risk.    Time  4    Period  Weeks    Status  New    Target Date  04/29/20      PT LONG TERM GOAL #2   Title  Pt. will perform 10 sit to stands without use of hands to improve patient's safety within and outside of the home.    Baseline  extra time to stand and requires occasional use of hands for safety/ control    Time  4    Period  Weeks    Status  New    Target Date  04/29/20      PT LONG TERM GOAL #3   Title  Pt. able to ambulate  5 minutes on level surfaces with appropriate 2-point gait pattern and no falls to improve safety.    Baseline  LOB with head turns/ position changes/ inconsistent use of SPC.  Pt. educated on use of RW/ benefits    Time  4    Period  Weeks    Status  New    Target Date  04/29/20      PT LONG TERM GOAL #4   Title  Pt. will ambulate with more consistent hip flexion/ heel strike/ BOS to improve safety with gait while use of SPC.    Baseline  Pt. ambulates with a shuffling/ staggered gait with short stride length. Pt. has limited heel strike and inconsistent SPC use while walking around PT clinic.    Time  4    Period  Weeks    Status  New    Target Date  04/29/20            Plan - 04/21/20 1245    Clinical Impression Statement  Pt. had 2 LOB while walking in hallway working on consistent heel strike with ankle wts. but able to self-correct with use of SPC.  Pt. works really hard during resisted ther.ex but needs mod. cuing for proper technique/ focus.  Pt. easily distracted during standing  ex. and benefits from visual/verbal demonstration.  PT encouarged pt. to use RW at home.    Stability/Clinical Decision Making  Evolving/Moderate complexity    Clinical Decision Making  Moderate    Rehab Potential  Fair    Clinical Impairments Affecting Rehab Potential  hx of falls, personal hx,     PT Frequency  2x / week    PT Duration  4 weeks    PT Treatment/Interventions  ADLs/Self Care Home Management;Aquatic Therapy;Biofeedback;Electrical Stimulation;Moist Heat;DME Instruction;Gait training;Stair training;Functional mobility training;Therapeutic activities;Therapeutic exercise;Balance training;Neuromuscular re-education;Patient/family education;Manual techniques;Passive range of motion;Energy conservation;Taping;Vestibular;Visual/perceptual remediation/compensation    PT Next Visit Plan  Progress static/dynamic balance.    Consulted and Agree with Plan of Care  Patient;Family member/caregiver       Patient will benefit from skilled therapeutic intervention in order to improve the following deficits and impairments:  Abnormal gait, Decreased activity tolerance, Decreased balance, Decreased coordination, Decreased endurance, Decreased mobility, Decreased knowledge of use of DME, Decreased safety awareness, Difficulty walking, Decreased strength, Dizziness, Improper body mechanics, Postural dysfunction  Visit Diagnosis: Gait difficulty  Muscle weakness (generalized)  Balance disorder  Risk for falls     Problem List There are no problems to display for this patient.  [name], PT, DPT # [phone] 04/21/2020, 2:30 PM  [place] [facility] [address] [place], [place], [address] Phone: [phone]   Fax:  [phone]  Name: Stanley Jackson MRN: [id number] Date of Birth: Feb 18, 1936

## 2020-04-22 ENCOUNTER — Telehealth: Payer: Self-pay

## 2020-04-22 ENCOUNTER — Other Ambulatory Visit: Payer: Self-pay

## 2020-04-23 DIAGNOSIS — I499 Cardiac arrhythmia, unspecified: Secondary | ICD-10-CM | POA: Diagnosis not present

## 2020-04-23 DIAGNOSIS — R4189 Other symptoms and signs involving cognitive functions and awareness: Secondary | ICD-10-CM | POA: Diagnosis not present

## 2020-04-23 DIAGNOSIS — F329 Major depressive disorder, single episode, unspecified: Secondary | ICD-10-CM | POA: Diagnosis not present

## 2020-04-23 NOTE — Telephone Encounter (Signed)
Telephone call to patient/family to schedule palliative care visit with patient. Patient/family in agreement with home visit on 04-01-20 at 1:00PM.

## 2020-04-30 ENCOUNTER — Ambulatory Visit: Payer: PPO | Attending: Neurology | Admitting: Physical Therapy

## 2020-04-30 ENCOUNTER — Other Ambulatory Visit: Payer: Self-pay

## 2020-04-30 ENCOUNTER — Encounter: Payer: Self-pay | Admitting: Physical Therapy

## 2020-04-30 DIAGNOSIS — R269 Unspecified abnormalities of gait and mobility: Secondary | ICD-10-CM | POA: Diagnosis not present

## 2020-04-30 DIAGNOSIS — M6281 Muscle weakness (generalized): Secondary | ICD-10-CM

## 2020-04-30 DIAGNOSIS — Z9181 History of falling: Secondary | ICD-10-CM | POA: Insufficient documentation

## 2020-04-30 DIAGNOSIS — R2689 Other abnormalities of gait and mobility: Secondary | ICD-10-CM | POA: Diagnosis not present

## 2020-04-30 NOTE — Therapy (Signed)
[place] [facility] [address] [place], [place], [address] Phone: [phone]   Fax:  [phone]  Physical Therapy Treatment  Patient Details  Name: Stanley Jackson MRN: [id number] Date of Birth: [birth year] Referring Provider (PT): Delray Alt, MD   Encounter Date: 04/30/2020  PT End of Session - 04/30/20 1254    Visit Number  4    Number of Visits  9    Date for PT Re-Evaluation  04/30/20    Authorization - Visit Number  4    Authorization - Number of Visits  10    PT Start Time  [id number]    PT Stop Time  1350    PT Time Calculation (min)  56 min    Equipment Utilized During Treatment  Gait belt    Activity Tolerance  Patient tolerated treatment well    Behavior During Therapy  [facility] for tasks assessed/performed       History reviewed. No pertinent past medical history.  History reviewed. No pertinent surgical history.  There were no vitals filed for this visit.  Subjective Assessment - 04/30/20 1251    Subjective  Pt. reports no pain and no new complaints.  Pt. reports walking dog this morning and no loss of balance/ falls.  Pt. using SPC while walking outside for safety.  Pts. wife reports pt. is not using RW at home and refuses.    Pertinent History  Pt. known to PT clinic for balance training in past.  Pt. continues to use [facility] for safety while walking dog and taking car of wife who is primarily bed bound.    Limitations  Standing;Walking;House hold activities    Patient Stated Goals  Decrease falls/ improve walking independence    Currently in Pain?  No/denies         There.ex.:  Nustep L5 10 min. B UE/LE (review of HEP/ use of SPC vs. RW) Standing LE ex. (4#): marching/ heel raises/ walking over green hurdles/ lateral walking 20x each in //-bars (min. To no UE assist/ mirror feedback for posture). Seated 4# LAQ/ marching 20x each.  Cuing for posture correction.   Step ups/ downs at 6" step with B UE to 1 UE assist (CGA for  safety/ cuing).  Reviewed HEP.   Neuro:  Hallway activities: high marching/ alt. UE and LE touches/ step over and up 3" plinths. Recip. Step touches L/R with no UE assist (2 LOB and required handrail for support)- CGA for safety.   Sit to stands (10x) with no UE assist from varying heights on blue mat table. Walking outside: grassy terrain/ curbs/ parking lot walking with moderate cuing to increase hip flexion/ heel strike. CGA for safety.  Pt. Carrying SPC and inconsistent use in hallway/ outside.  PT encouraged pt. On importance of RW use to decrease fall risk.        PT Long Term Goals - 04/03/20 2012      PT LONG TERM GOAL #1   Title  Pt. will increase Berg balance score to >48/56 to improve balance and decrease fall risk with daily tasks.    Baseline  43/56 on initial evaluation.  High fall risk.    Time  4    Period  Weeks    Status  New    Target Date  04/29/20      PT LONG TERM GOAL #2   Title  Pt. will perform 10 sit to stands without use of hands to improve patient's  safety within and outside of the home.    Baseline  extra time to stand and requires occasional use of hands for safety/ control    Time  4    Period  Weeks    Status  New    Target Date  04/29/20      PT LONG TERM GOAL #3   Title  Pt. able to ambulate 5 minutes on level surfaces with appropriate 2-point gait pattern and no falls to improve safety.    Baseline  LOB with head turns/ position changes/ inconsistent use of SPC.  Pt. educated on use of RW/ benefits    Time  4    Period  Weeks    Status  New    Target Date  04/29/20      PT LONG TERM GOAL #4   Title  Pt. will ambulate with more consistent hip flexion/ heel strike/ BOS to improve safety with gait while use of SPC.    Baseline  Pt. ambulates with a shuffling/ staggered gait with short stride length. Pt. has limited heel strike and inconsistent SPC use while walking around PT clinic.    Time  4    Period  Weeks    Status  New    Target  Date  04/29/20            Plan - 04/30/20 1254    Clinical Impression Statement  Pt. had several episodes of staggering gait while walking in hallway without use of SPC.  Pt. inconsistent with use of SPC and 2-point gait pattern in clinic . CGA required t/o tx. session, esp. during high marching/ lateral and backwards walking.  Pt. has difficulty self-correcting LOB and requires use of wall or //-bars to stabilize.  Pt. completes LE resisted ex. with proper technique and cuing to slow down/ control movement pattern.    Stability/Clinical Decision Making  Evolving/Moderate complexity    Clinical Decision Making  Moderate    Rehab Potential  Fair    Clinical Impairments Affecting Rehab Potential  hx of falls, personal hx,     PT Frequency  2x / week    PT Duration  4 weeks    PT Treatment/Interventions  ADLs/Self Care Home Management;Aquatic Therapy;Biofeedback;Electrical Stimulation;Moist Heat;DME Instruction;Gait training;Stair training;Functional mobility training;Therapeutic activities;Therapeutic exercise;Balance training;Neuromuscular re-education;Patient/family education;Manual techniques;Passive range of motion;Energy conservation;Taping;Vestibular;Visual/perceptual remediation/compensation    PT Next Visit Plan  Progress static/dynamic balance.  CHECK goals/ recert    Consulted and Agree with Plan of Care  Patient;Family member/caregiver       Patient will benefit from skilled therapeutic intervention in order to improve the following deficits and impairments:  Abnormal gait, Decreased activity tolerance, Decreased balance, Decreased coordination, Decreased endurance, Decreased mobility, Decreased knowledge of use of DME, Decreased safety awareness, Difficulty walking, Decreased strength, Dizziness, Improper body mechanics, Postural dysfunction  Visit Diagnosis: Gait difficulty  Muscle weakness (generalized)  Balance disorder  Risk for falls     Problem List There are no  problems to display for this patient.  [name], PT, DPT # [phone] 04/30/2020, 8:47 PM  [place] [address] [place], [place], [address] Phone: [phone]   Fax:  [phone]  Name: MARLIN DENNEN MRN: [id number] Date of Birth: 1936-01-06

## 2020-05-01 ENCOUNTER — Other Ambulatory Visit: Payer: PPO

## 2020-05-01 DIAGNOSIS — Z515 Encounter for palliative care: Secondary | ICD-10-CM

## 2020-05-01 NOTE — Progress Notes (Signed)
PATIENT NAME: Stanley Jackson DOB: Sep 25, 1936 MRN: [id number]  PRIMARY CARE PROVIDER: No primary care provider on file.  RESPONSIBLE PARTY:  Acct ID - Guarantor Home Phone Work Phone Relationship Acct Type  [id number] GOBLE, FUDALA [phone] Self P/F     [place], [place] [address]    PLAN OF CARE and INTERVENTIONS:               1.  GOALS OF CARE/ ADVANCE CARE PLANNING:  Remain at home with wife Diane.               2.  PATIENT/CAREGIVER EDUCATION:  Education on fall precautions, education on s/s of infection, reviewed meds, support                3.  DISEASE STATUS: SW and RN made scheduled palliative care home visit. Palliative care team met with patient and his wife [name]. Patient sitting in chair alert to self and familiars. Patient states he enjoys going outside and walking their Therapist, sports. Patient denies pain at the present time. Patients past medical history includes but not limited to B12 deficiency, anxiety, COPD, depression, trimmer, gait instability, memory loss and hearing loss. Patient has had a significant decline in cognitive and functional abilities in the last few years per wife.  Wife reports Delray Alt will be patients PCP.  Patient is receiving outpatient PT. Wife reports patient will use his cane but refuses to use his rolling walker. Patient is able to do his own personal care at the present time. Wife reports patient has not suffered any falls recently. Patient continues to smoke approximately 1 pack of cigarettes per day. The only medicines patients taking is Zoloft, Anoro and Loperamide for loose stools. Patient is 5'10 in hight and current weight is 132 lbs. Wife reports patient has had frequent loose stools and feels this may have caused patients weight loss.  Wife reports patient had EKG at MD's office and EKG showed normal sinus rhythm. Patient reports he does have some shortness of breath on exertion but feels this is related to smoking. Patient  denies having any cough. Patient has no visible open areas of skin breakdown. Wife reports patient does not do physical therapy exercises as instructed by PT. Patient has no edema in his lower extremities. Patients breath sounds are clear. Patient reports he is been sleeping well at night and wife reports patient will go to bed at 8 PM  p.m. and sleep until 10 AM. Patient has a Press photographer form and wife to look through paperwork and have at next visit. SW to obtain a DNR for patient. Patient and wife have received CoVid 19 vaccinations. Patient and wife in agreement with palliative care services. Palliatiative team left contact information left in the home.  Patient and wife instructed to contact palliative care with questions or concerns.     HISTORY OF PRESENT ILLNESS:  Patient is a 84 year old male who resides in home with wife Diane.  Patient and wife open to palliative care services and will be seen monthly and PRN.  CODE STATUS: No Code, SW will request DNR   ADVANCED DIRECTIVES: Y MOST FORM: No  PPS: 50%   PHYSICAL EXAM:   VITALS:There were no vitals filed for this visit.  LUNGS: clear to auscultation  CARDIAC: Cor RRR  EXTREMITIES: none edema SKIN: bruising to upper arms  NEURO: positive for gait problems and memory problems  Paulmichael Schreck, RN 

## 2020-05-01 NOTE — Progress Notes (Signed)
COMMUNITY PALLIATIVE CARE SW NOTE  PATIENT NAME: Stanley Jackson DOB: 26-Jun-1936 MRN: [id number]  PRIMARY CARE PROVIDER: Baxter Hire, MD  RESPONSIBLE PARTY:  Acct ID - Guarantor Home Phone Work Phone Relationship Acct Type  [id number] RONAL, [place] [phone] Self P/F     [place], [place] [address]     PLAN OF CARE and INTERVENTIONS:             1. GOALS OF CARE/ ADVANCE CARE PLANNING:  Patient is DNR, needs form. SW requested from NP. [name] said patient has completed HCPOA paperwork, she is his HCPOA. [name] could not locate forms during visit. Goal is to remain at home with [facility].  2. SOCIAL/EMOTIONAL/SPIRITUAL ASSESSMENT/ INTERVENTIONS:  SW and RN met with patient and [name] (patient's wife). [name] helped provide history and brief overview. Patient denies pain. No recent falls but [name] notes many falls over the past few months. [name] appetite is better, doing small meals. Patient also drinks protein shakes. Patient is sleeping well at night. Patient and [name] live in a first floor apartment together and have been married for 25 years. Patient has three children that live in [place] and [place], and are not involved. Carolyn's granddaughter is local but limited involvement. 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 Talked of his work and how much he enjoyed it. Patient also enjoys boating but does not do that anymore. Patient enjoys his dog, Lorre Nick. Discussed care and safety. SW provided education on palliative care, discussed care and goals, provided emotional support and used active and reflective listening. 3. PATIENT/CAREGIVER EDUCATION/ COPING:  Patient is alert, engaged. Patient attempts to answer questions appropriately. Patient did state that he was "84 years old" and wife corrected him. Patient was able to accurately state his birthdate. Discussed cognitive challenges. Patient said he gets agitated, [name] said they give one  another space if they get frustrated with one another. [name] feels that the medication PCP prescribed is helping. Patient enjoys watching television and walking the dog outside. Patient smokes about one pack of cigarettes a day. Patient said he has no interest in quitting. Noted that this helps calm his nerves. [name] said that have limited family support. 4. PERSONAL EMERGENCY PLAN:  Family will call 9-1-1 for emergencies. 5. COMMUNITY RESOURCES COORDINATION/ HEALTH CARE NAVIGATION:  Carolyn coordinates care. Patient is going to outpatient PT but [name] said they are having increased difficulty with getting to these appointments. 6. FINANCIAL/LEGAL CONCERNS/INTERVENTIONS:  Patient has fixed income. Discussed additional resources (Meals on Wheels, assisted living, etc). Patient and [name] were not interested at this time. Ongoing discussion.     SOCIAL HX:  Social History   Tobacco Use  . Smoking status: Current Every Day Smoker    Packs/day: 0.50    Types: Cigarettes  . Smokeless tobacco: Never Used  Substance Use Topics  . Alcohol use: No    Comment: quit a few years ago    CODE STATUS: DNR ADVANCED DIRECTIVES: N MOST FORM COMPLETE:  To be discussed.  HOSPICE EDUCATION PROVIDED: None.  PPS: Patient is ambulating with his walker. Patient is mostly independent of ADLs but needs standby for safety.   I spent 60 minutes with patient/family, from 1:00-2:00p providing education, support and consultation.   Margaretmary Lombard, LCSW

## 2020-05-07 ENCOUNTER — Telehealth: Payer: Self-pay | Admitting: Primary Care

## 2020-05-07 ENCOUNTER — Ambulatory Visit: Payer: PPO | Admitting: Physical Therapy

## 2020-05-07 NOTE — Telephone Encounter (Signed)
Request from SW to send DNR form, mailed today and uploaded to [facility].

## 2020-06-03 ENCOUNTER — Telehealth: Payer: Self-pay

## 2020-06-03 NOTE — Telephone Encounter (Signed)
Telephone call to patients wife Stanley Jackson to schedule palliative care visit.  Wife in agreement with RN making home visit 06/04/20 at 12:30 PM.

## 2020-06-04 ENCOUNTER — Other Ambulatory Visit: Payer: PPO

## 2020-06-04 ENCOUNTER — Other Ambulatory Visit: Payer: Self-pay

## 2020-06-04 DIAGNOSIS — Z515 Encounter for palliative care: Secondary | ICD-10-CM

## 2020-06-04 NOTE — Progress Notes (Signed)
PATIENT NAME: Stanley Jackson DOB: 1936-10-12 MRN: [id number]  PRIMARY CARE PROVIDER: No primary care provider on file.  RESPONSIBLE PARTY:  Acct ID - Guarantor Home Phone Work Phone Relationship Acct Type  [id number] EFREN, KROSS [phone] Self P/F     [place], [place] [address]    PLAN OF CARE and INTERVENTIONS:               1.  GOALS OF CARE/ ADVANCE CARE PLANNING:  Remain in home with wife for as long as care can be managed.                2.  PATIENT/CAREGIVER EDUCATION:  Education on fall precautions, education on s/s of infection, reviewed meds, support               3.  DISEASE STATUS:  RN made scheduled palliative care home visit. Nurse met with patient and his wife Diane. Patient sitting in recliner chair and states he has just come in from the outside. Patients temperature slightly elevated 99.9. Patient informed wife if she says another word that he is going back outside. Nurse talks with patient and asks if he is planning on [facility]. Patient is agreeable to trying program. Patient gets aggravated with wife as he states she constantly asks him how he is doing and telling him he has to do this or do that. Patient has not shaved in several days. Patient informs nurse he has been lazy. Patient states "not lately" when nurse asked him if he has suffered any falls. Patient denies pain at the present time. Patient reports his appetite is fair. Patients current weight 133 lbs. Wife reports patient is scheduled to see MD again July 29th. Patient has not been going to outpatient rehab. Patient has no problem sleeping. Patient is drinking nutritional supplements and wife reports she has been making patient protein shakes in blender. Patient uses Anoro inhaler during visit as wife asked him if he has used inhaler. Patients breath sounds are clear and patient reports he has a productive cough of white mucus at times. Patient has trace edema in his  lower extremities. Patients vital signs stable other than temperature being slightly elevated. Patient has had no changes in current medications. Patient and wife remained in agreement with palliative care services. Patient and wife encouraged to contact palliative care with questions or concerns.     HISTORY OF PRESENT ILLNESS:  Patient is a 84 year old male who resides in home with wife Diane.  Patient is followed by palliative care and is seen monthly and PRN.  CODE STATUS: DNR  ADVANCED DIRECTIVES: Y MOST FORM: No PPS: 50%   PHYSICAL EXAM:   VITALS: Today's Vitals   06/04/20 1241  Weight: 133 lb (60.3 kg)  PainSc: 0-No pain    LUNGS: clear to auscultation  CARDIAC: Cor RRR  EXTREMITIES: Trace edema SKIN: Skin color, texture, turgor normal. No rashes or lesions  NEURO: positive for gait problems and memory problems       Nilda Simmer, RN

## 2020-07-08 ENCOUNTER — Telehealth: Payer: Self-pay

## 2020-07-08 ENCOUNTER — Telehealth: Payer: Self-pay | Admitting: Primary Care

## 2020-07-08 NOTE — Telephone Encounter (Signed)
Palliative care SW spoke with patients wife, in agreement with home visit for Fri 07-11-2020 @230pm 

## 2020-07-08 NOTE — Telephone Encounter (Signed)
OPENED IN ERROR

## 2020-07-11 ENCOUNTER — Other Ambulatory Visit: Payer: PPO

## 2020-07-11 ENCOUNTER — Other Ambulatory Visit: Payer: Self-pay

## 2020-07-11 DIAGNOSIS — Z515 Encounter for palliative care: Secondary | ICD-10-CM

## 2020-07-11 NOTE — Progress Notes (Signed)
PATIENT NAME: Stanley Jackson DOB: 23-Nov-1936 MRN: [id number]  PRIMARY CARE PROVIDER: Nelda Marseille, MD  RESPONSIBLE PARTY:  Acct ID - Guarantor Home Phone Work Phone Relationship Acct Type  [id number] NAJEEB, UPTAIN [phone] Self P/F     [place], [place] [address]    PLAN OF CARE and INTERVENTIONS:               1.  GOALS OF CARE/ ADVANCE CARE PLANNING:  Remain at home with wife and function as independently as psssible.               2.  PATIENT/CAREGIVER EDUCATION:  Education on fall precautions, education on s/s of infection, reviewed meds, support               3.  DISEASE STATUS:SW [place] and RN made scheduled palliative care home visit. Palliative care team met with patient and patients wife Stanley Jackson.  Patient sitting in his chair talking with wife. Patient denies having any pain at the present time. Patients gait is slow however patient denies suffering any recent falls. Patient current weight is 131 lbs. Patient has had a 2 lb weight loss since last nursing visit. Patient continues to smoke and has a chronic non productive cough. Patient reports he has shortness of breath on exertion. Wife reports patients appetite is good and patient continues to enjoy eating sweets. Patients heart rate currently 54 slightly bradycardic at visit today. Patients other vital signs are stable. Patient scheduled to see MD the end of the month per wife. Patient reports he has no trouble sleeping. Nurse reviewed patient's medications with patient. Patient currently taking Sertraline, Mirtazapine and a low dose aspirin. Patient is not taking any other medications. Wife continues to drive patient to MD appointments as patient mailed in his driver's license. Patient and wife continue to go out to eat. Patient and wife remain in agreement with palliative care services. Patient and wife encouraged to contact palliative care with questions or concerns.         HISTORY OF PRESENT  ILLNESS:  Patient is a 84 year old male who resides in home with wife.  Patient is followed by palliative care.  Patient is seen monthly and PRN.   CODE STATUS: DNR  ADVANCED DIRECTIVES: Y MOST FORM: No PPS: 50%   PHYSICAL EXAM:   VITALS: Today's Vitals   07/11/20 1509  BP: 108/62  Pulse: (!) 54  Resp: 16  Temp: 98.9 F (37.2 C)  TempSrc: Temporal  SpO2: 92%  Weight: 131 lb (59.4 kg)  PainSc: 0-No pain    LUNGS: clear to auscultation  CARDIAC: 54 Bradycardia EXTREMITIES: Trace edema SKIN: bruising to upper arms  NEURO: positive for gait problems and memory problems       Nilda Simmer, RN

## 2020-07-11 NOTE — Progress Notes (Signed)
COMMUNITY PALLIATIVE CARE SW NOTE  PATIENT NAME: Stanley Jackson DOB: 10-May-1936 MRN: [id number]  PRIMARY CARE PROVIDER: Nelda Marseille, MD  RESPONSIBLE PARTY:  Acct ID - Guarantor Home Phone Work Phone Relationship Acct Type  [id number] COREY, L[place]I [phone] Self P/F     [address], [place], [place] [address]     PLAN OF CARE and INTERVENTIONS:             1. GOALS OF CARE/ ADVANCE CARE PLANNING:  Patient is a DNR. Wife reports she has HCPOA. Goal is for patient to remain the home with wife as long as possible. 2. SOCIAL/EMOTIONAL/SPIRITUAL ASSESSMENT/ INTERVENTIONS:  SW and RN met with patient and [name] (patient's wife). [name] helped provide history and brief overview. Patient denies pain. No recent falls. [name] appetite is okay, Patient also drinks protein shakes. Patient enjoys chocolate and snacking through out the day. Patient is sleeping well at night. Wife shared that there had not been any medication changes. Patient current weight is 131lb, patient weighed self in bathroom during the visit. Patient and wife did not have any concerns for palliative team. Will continue to follow. 3. PATIENT/CAREGIVER EDUCATION/ COPING:  Patient is alert, engaged. Wife shared that patients agitation has improved. Patient continues to smoke about one pack of cigarettes a day. Patient enjoys walking the dog. Patient said he has no interest in quitting. [name] said that have limited family support. 4. PERSONAL EMERGENCY PLAN: patient to call 9-1-1 for emergencies.  5. COMMUNITY RESOURCES COORDINATION/ HEALTH CARE NAVIGATION:  Carolyn coordinates care. 6. FINANCIAL/LEGAL CONCERNS/INTERVENTIONS:  Patient has fixed income.     SOCIAL HX:  Social History   Tobacco Use  . Smoking status: Current Every Day Smoker    Packs/day: 0.50    Types: Cigarettes  . Smokeless tobacco: Never Used  Substance Use Topics  . Alcohol use: No    Comment: quit a few years ago    CODE  STATUS: DNR  ADVANCED DIRECTIVES: Y MOST FORM COMPLETE: N HOSPICE EDUCATION PROVIDED: N  [id number] is ambulating without with AD, but uses cane as needed. Patient is mostly independent of ADLs but needs standby for safety.    Time Spent: visit lasted 40 mins.   Doreene Eland, [place]

## 2020-07-24 DIAGNOSIS — R413 Other amnesia: Secondary | ICD-10-CM | POA: Diagnosis not present

## 2020-07-24 DIAGNOSIS — R4586 Emotional lability: Secondary | ICD-10-CM | POA: Diagnosis not present

## 2020-07-24 DIAGNOSIS — R4189 Other symptoms and signs involving cognitive functions and awareness: Secondary | ICD-10-CM | POA: Diagnosis not present

## 2020-07-24 DIAGNOSIS — J449 Chronic obstructive pulmonary disease, unspecified: Secondary | ICD-10-CM | POA: Diagnosis not present

## 2020-07-24 DIAGNOSIS — E538 Deficiency of other specified B group vitamins: Secondary | ICD-10-CM | POA: Diagnosis not present

## 2020-07-24 DIAGNOSIS — F0391 Unspecified dementia with behavioral disturbance: Secondary | ICD-10-CM | POA: Diagnosis not present

## 2020-07-24 DIAGNOSIS — R269 Unspecified abnormalities of gait and mobility: Secondary | ICD-10-CM | POA: Diagnosis not present

## 2020-07-24 DIAGNOSIS — F329 Major depressive disorder, single episode, unspecified: Secondary | ICD-10-CM | POA: Diagnosis not present

## 2020-07-24 DIAGNOSIS — G250 Essential tremor: Secondary | ICD-10-CM | POA: Diagnosis not present

## 2020-07-25 DIAGNOSIS — F03B18 Unspecified dementia, moderate, with other behavioral disturbance: Secondary | ICD-10-CM | POA: Insufficient documentation

## 2020-09-18 DIAGNOSIS — J432 Centrilobular emphysema: Secondary | ICD-10-CM | POA: Diagnosis not present

## 2020-09-18 DIAGNOSIS — R0600 Dyspnea, unspecified: Secondary | ICD-10-CM | POA: Diagnosis not present

## 2020-09-18 DIAGNOSIS — Z23 Encounter for immunization: Secondary | ICD-10-CM | POA: Diagnosis not present

## 2020-09-18 DIAGNOSIS — J9811 Atelectasis: Secondary | ICD-10-CM | POA: Diagnosis not present

## 2020-09-24 ENCOUNTER — Telehealth: Payer: Self-pay

## 2020-09-24 NOTE — Telephone Encounter (Signed)
Palliative care SW outreached patient to complete telephonic visit. Patients wife, Stanley Jackson, provided update on medical condition and/or changes. Wife shared that patient has been fair. Wife shared she feels patients dementia is progressing. Patient eats well, wife preps meals and a chocolate whey protein shake daily. No pain reported. Wife shares that patient had a fall last week coming into the house, with no injuries. No medication change. Wife shares that medically patient has been okay but today was not a good day for her. Patient continues to enjoy spending time ling dog. Wife shared that SW at [place] care provided her some advance directive forms that she will be reviewing with lawyer. Wife appreciative of telephonic check in. Palliative care will continue to monitor and assist with long term care planning as needed.

## 2020-10-20 ENCOUNTER — Telehealth: Payer: Self-pay

## 2020-10-20 NOTE — Telephone Encounter (Signed)
Message received from [facility].  Return call made at 1025 am.  Report from wife is that patient is doing fairly well.  He is doing some activities in the home.  Watching television, taking the dog outside and ambulating about 75 ft at a time, taking garbage out and helping with some of the household chores.  Patient has not sustained any falls in the last month.  His appetite has been good. Wife reports making protein shakes or cereal in the morning.  She is cooking meals occasionally for lunch/dinner.  Patient is staying well hydrated with Naked drinks and blueberry drinks.  Medications reviewed and patient is only taking sertraline 100 mg daily anoro, and remeron 15 mg daily.  Pain is reported to bilateral feet and wife is hopeful for a podiatry appointment.  They have tried several different shoes but none have been helpful with the pain.  Patient is scheduled to see Dr. Donnal Debar next Friday for a routine check up.  No other concerns are voiced at this time.  I advised that Palliative Care would contact them again next month for follow up on patient's overall condition.

## 2020-10-20 NOTE — Telephone Encounter (Signed)
1009 am.  Phone call made to patient for monthly check in.  No answer but message has been left on voicemail.  PLAN:  Awaiting call back.

## 2020-10-31 DIAGNOSIS — Z23 Encounter for immunization: Secondary | ICD-10-CM | POA: Diagnosis not present

## 2020-10-31 DIAGNOSIS — Z9181 History of falling: Secondary | ICD-10-CM | POA: Diagnosis not present

## 2020-11-14 ENCOUNTER — Telehealth: Payer: Self-pay

## 2020-11-14 NOTE — Telephone Encounter (Signed)
11:44AM: Palliative care SW outreached patient/family for monthly telephonic visit.  SW left HIPPA complaint VM. Awaiting return call.  Will continue to offer palliative care support.

## 2020-12-09 ENCOUNTER — Telehealth: Payer: Self-pay

## 2020-12-09 NOTE — Telephone Encounter (Signed)
115 pm.  Phone call completed to wife to follow up on patient's condition.  Wife answer the phone and states she is just getting out of the car and is trying to balance herself.  She is requesting a call back at a later time.  PLAN:  PC team will reach out wife at a later time.

## 2021-01-16 ENCOUNTER — Telehealth: Payer: Self-pay

## 2021-01-16 NOTE — Telephone Encounter (Signed)
128 pm.  Phone call made to spouse [name] to complete a telephonic visit.  Wife states patient has been doing pretty good.  Patient typically sits by the television during the day.  He will take the dog out and walks to the mailbox almost daily. Patient is able to assist with light housekeeping.  Patient is using a cane to ambulate safely.  He does have walker but does not use this.  No falls are reported.  [name] appetite has been good.  Patient snacks a lot during the day.  Wife states patient's weight has been stable and he may have added a pound or two.   Patient has poor vision and is need of cataract surgery.  His license was removed last year due to worsening cognitive function and poor vision.  Wife states that patient was very upset to loose his license but he has come to terms with this.  Patient does have some issues with diarrhea.  This has been life long per wife.  He is taking imodium almost daily.   Patient continues to smoke daily.  He does have shortness of breath with exertion.   He will use a rescue inhaler as needed.  He is no longer taking Neurontin or Aricept.  Remeron was added to help stimulate patient's appetite.    Wife discusses the challenges of caregiving as she and the patient both have medical issues.  She is considering asking her PCP to refer her to Palliative Care for support for herself.  Wife denies any new concerns at this time.  Palliative Care will follow up again next month.  Wife is encouraged to reach out to [facility] should a visit be needed.

## 2021-02-06 DIAGNOSIS — R269 Unspecified abnormalities of gait and mobility: Secondary | ICD-10-CM | POA: Diagnosis not present

## 2021-02-06 DIAGNOSIS — Z720 Tobacco use: Secondary | ICD-10-CM | POA: Diagnosis not present

## 2021-02-06 DIAGNOSIS — F0391 Unspecified dementia with behavioral disturbance: Secondary | ICD-10-CM | POA: Diagnosis not present

## 2021-02-06 DIAGNOSIS — G250 Essential tremor: Secondary | ICD-10-CM | POA: Diagnosis not present

## 2021-02-06 DIAGNOSIS — E538 Deficiency of other specified B group vitamins: Secondary | ICD-10-CM | POA: Diagnosis not present

## 2021-02-06 DIAGNOSIS — G6289 Other specified polyneuropathies: Secondary | ICD-10-CM | POA: Diagnosis not present

## 2021-02-06 DIAGNOSIS — Z9181 History of falling: Secondary | ICD-10-CM | POA: Diagnosis not present

## 2021-02-06 DIAGNOSIS — J449 Chronic obstructive pulmonary disease, unspecified: Secondary | ICD-10-CM | POA: Diagnosis not present

## 2021-02-06 DIAGNOSIS — F32A Depression, unspecified: Secondary | ICD-10-CM | POA: Diagnosis not present

## 2021-02-17 ENCOUNTER — Telehealth: Payer: Self-pay

## 2021-02-17 NOTE — Telephone Encounter (Signed)
02/17/21 @12 :20PM: Palliative care SW outreached patient/spouse for telephonic visit.   Spouse is need of support and resources for patient. In person visit scheduled for 3/9 @1pm .

## 2021-02-19 DIAGNOSIS — G250 Essential tremor: Secondary | ICD-10-CM | POA: Diagnosis not present

## 2021-02-19 DIAGNOSIS — D519 Vitamin B12 deficiency anemia, unspecified: Secondary | ICD-10-CM | POA: Diagnosis not present

## 2021-02-19 DIAGNOSIS — F32A Depression, unspecified: Secondary | ICD-10-CM | POA: Diagnosis not present

## 2021-02-19 DIAGNOSIS — G309 Alzheimer's disease, unspecified: Secondary | ICD-10-CM | POA: Diagnosis not present

## 2021-02-19 DIAGNOSIS — J449 Chronic obstructive pulmonary disease, unspecified: Secondary | ICD-10-CM | POA: Diagnosis not present

## 2021-02-19 DIAGNOSIS — Z9181 History of falling: Secondary | ICD-10-CM | POA: Diagnosis not present

## 2021-02-19 DIAGNOSIS — G6289 Other specified polyneuropathies: Secondary | ICD-10-CM | POA: Diagnosis not present

## 2021-02-19 DIAGNOSIS — F0280 Dementia in other diseases classified elsewhere without behavioral disturbance: Secondary | ICD-10-CM | POA: Diagnosis not present

## 2021-02-19 DIAGNOSIS — F419 Anxiety disorder, unspecified: Secondary | ICD-10-CM | POA: Diagnosis not present

## 2021-02-26 DIAGNOSIS — Z9181 History of falling: Secondary | ICD-10-CM | POA: Diagnosis not present

## 2021-02-26 DIAGNOSIS — F32A Depression, unspecified: Secondary | ICD-10-CM | POA: Diagnosis not present

## 2021-02-26 DIAGNOSIS — J449 Chronic obstructive pulmonary disease, unspecified: Secondary | ICD-10-CM | POA: Diagnosis not present

## 2021-02-26 DIAGNOSIS — D519 Vitamin B12 deficiency anemia, unspecified: Secondary | ICD-10-CM | POA: Diagnosis not present

## 2021-02-26 DIAGNOSIS — G250 Essential tremor: Secondary | ICD-10-CM | POA: Diagnosis not present

## 2021-02-26 DIAGNOSIS — F0280 Dementia in other diseases classified elsewhere without behavioral disturbance: Secondary | ICD-10-CM | POA: Diagnosis not present

## 2021-02-26 DIAGNOSIS — G6289 Other specified polyneuropathies: Secondary | ICD-10-CM | POA: Diagnosis not present

## 2021-02-26 DIAGNOSIS — G309 Alzheimer's disease, unspecified: Secondary | ICD-10-CM | POA: Diagnosis not present

## 2021-02-26 DIAGNOSIS — F419 Anxiety disorder, unspecified: Secondary | ICD-10-CM | POA: Diagnosis not present

## 2021-03-02 ENCOUNTER — Telehealth: Payer: Self-pay

## 2021-03-02 NOTE — Telephone Encounter (Signed)
11AM: Palliative care SW outreached spouse to reschedule in home visit from 3/9 to a different date. Spouse states that she did not see the point or reason for palliative care services or the visits offered by palliative. Spouse shared that any issues she has she lets PCP know and that patient is receiving PT at the moment and they are checking patients vitals, plus patients vitals are always good. She stated that patients main issue is his dementia. SW attempted to share the support that palliative care can continue to offer and provide around patients progressing dementia and discuss long term  Planning Spouse shared that she was told already that they make too much money to apply for community Medicaid and they can not afford private care. Spouse shared that she has her own health issues  and will continue to just keep patient at home and provide care for him. She stated that she cannot risk him going into a facility  Long term due to his income being the main provider for the home. SW advised spouse that palliative care can continue to follow for support. Wide declined and sated that SW can make pcp aware.

## 2021-03-04 ENCOUNTER — Other Ambulatory Visit: Payer: PPO

## 2021-03-30 DIAGNOSIS — F0280 Dementia in other diseases classified elsewhere without behavioral disturbance: Secondary | ICD-10-CM | POA: Diagnosis not present

## 2021-03-30 DIAGNOSIS — G309 Alzheimer's disease, unspecified: Secondary | ICD-10-CM | POA: Diagnosis not present

## 2021-03-30 DIAGNOSIS — F419 Anxiety disorder, unspecified: Secondary | ICD-10-CM | POA: Diagnosis not present

## 2021-03-30 DIAGNOSIS — D519 Vitamin B12 deficiency anemia, unspecified: Secondary | ICD-10-CM | POA: Diagnosis not present

## 2021-03-30 DIAGNOSIS — Z9181 History of falling: Secondary | ICD-10-CM | POA: Diagnosis not present

## 2021-03-30 DIAGNOSIS — J449 Chronic obstructive pulmonary disease, unspecified: Secondary | ICD-10-CM | POA: Diagnosis not present

## 2021-03-30 DIAGNOSIS — G6289 Other specified polyneuropathies: Secondary | ICD-10-CM | POA: Diagnosis not present

## 2021-03-30 DIAGNOSIS — G250 Essential tremor: Secondary | ICD-10-CM | POA: Diagnosis not present

## 2021-03-30 DIAGNOSIS — F32A Depression, unspecified: Secondary | ICD-10-CM | POA: Diagnosis not present

## 2021-04-06 ENCOUNTER — Telehealth: Payer: Self-pay

## 2021-04-06 NOTE — Telephone Encounter (Signed)
1120 am.  Phone call made to wife Stanley Jackson to offer a telephonic or in-home visit.  Stanley Jackson is okay with a home visit but not this week.  Visit is scheduled for 4/25 @ 1 pm.

## 2021-04-20 ENCOUNTER — Other Ambulatory Visit: Payer: Self-pay

## 2021-04-20 ENCOUNTER — Other Ambulatory Visit: Payer: PPO

## 2021-04-20 VITALS — BP 124/60 | HR 73 | Temp 98.2°F | Resp 18 | Wt 128.6 lb

## 2021-04-20 DIAGNOSIS — Z515 Encounter for palliative care: Secondary | ICD-10-CM

## 2021-04-20 NOTE — Progress Notes (Signed)
PATIENT NAME: Stanley Jackson DOB: 1936-01-05 MRN: [id number]  PRIMARY CARE PROVIDER: Nelda Marseille, MD  RESPONSIBLE PARTY:  Acct ID - Guarantor Home Phone Work Phone Relationship Acct Type  [id number] BAYANI, RENTERIA [phone] Self P/F     [place], [place] [address]    PLAN OF CARE and INTERVENTIONS:               1.  GOALS OF CARE/ ADVANCE CARE PLANNING:  Remain home with the assistance of his wife.               2.  PATIENT/CAREGIVER EDUCATION:  Safety.               4. PERSONAL EMERGENCY PLAN:  Activate 911 for emergencies.               5.  DISEASE STATUS:  Joint visit with [place], SW, wife and patient.   Patient remains quiet and withdrawn during the visit.  He will answer questions appropriately but does not engage in conversation.  Wife provides additional information and discusses her own health issues and concerns.    Patient is ambulatory without assistive devices in the home.  He does have a shuffling and unsteady gait.  Patient states he uses a cane when he goes out.  No recent falls reported.  Encouraged patient to use his cane when ambulating at all times for safety.  Patient denies issues with insomnia and will take occasional naps during the day.  Patient has lost about 5 lbs since last June.  Po intake has been good and wife is making shakes with a supplement drink.  Patient is eating 2-3 meals a day with snacking throughout the day.  Wife reports she has a helper coming in when she needs help with housework.  She has explored the [place] at [place] but states the patient is refusing to go anywhere.  He desires to remain in his apartment until he dies.  Wife states the cost to live in a facility is more than what it cost to remain in the apartment.  She feels she is able to provide care to patient despite her own declining health.   Patient continues to smoke a 1/2 pack of cigarettes a day.  He is not interested in smoking cessation.   Patient  denies shortness of breath.  Wife states patient has Anora but is not using it daily.  Encouraged patient to remain compliant with inhaler.  Wife is filling patient's pill box weekly.  Patient denies any concerns at this time.  He states he is at the age that anything could happen.    HISTORY OF PRESENT ILLNESS:  85 year old male with COPD and Dementia.  Patient is being followed by Palliative Care monthly and PRN.  CODE STATUS: DNR ADVANCED DIRECTIVES: No MOST FORM: No PPS: 50%   PHYSICAL EXAM:   VITALS: Today's Vitals   04/20/21 1316  BP: 124/60  Pulse: 73  Resp: 18  Temp: 98.2 F (36.8 C)  SpO2: 95%  Weight: 128 lb 9.6 oz (58.3 kg)  PainSc: 0-No pain    LUNGS: CTA, no rhonchi, rales or wheezes present. CARDIAC: HR IRR EXTREMITIES: - for edema SKIN: warm and dry to touch.  No skin breakdown reported. NEURO: alert and oriented with some forgetfulness.       Lorenza Burton, RN

## 2021-04-20 NOTE — Progress Notes (Signed)
COMMUNITY PALLIATIVE CARE SW NOTE  PATIENT NAME: Stanley Jackson DOB: 09/07/1936 MRN: [id number]  PRIMARY CARE PROVIDER: Nelda Marseille, MD  RESPONSIBLE PARTY:  Acct ID - Guarantor Home Phone Work Phone Relationship Acct Type  [id number] Stanley Jackson [phone] Self P/F     [address], [place], [place] [address]     PLAN OF CARE and INTERVENTIONS:             1. GOALS OF CARE/ ADVANCE CARE PLANNING:  Patient is a DNR. Wife reports she has HCPOA. Goal is for patient to remain the home with wife as long as possible.  2. SOCIAL/EMOTIONAL/SPIRITUAL ASSESSMENT/ INTERVENTIONS:  SW and RN met with patient and Stanley Jackson (patient's wife) for monthly in home visit. Patient lives in apartment complex on first floor with spouse. Patient has progressing dementia.  Stanley Jackson helped provide history and brief overview. Patient denies pain. No recent falls. [name] appetite is fair, Patient also drinks protein shakes. Patient enjoys chocolate and snacking throughout the day. Patient is sleeping well at night. Patient current weight is 128lb, patient weighed self in bathroom during the visit.   RN reviewed medications and took vitals.   SW and RN discusses long term planning with patient and spouse. Spouse shared that she has someone that comes into the home to assist with house hold chores every so often. Spouse shared that she viewed [facility] and has declined placement at this time. Spouse is aware of resources available to her and patient. Spouse and patient prefer to remain at home. Spouse shares it will be more economical for them to stay at home as she is a trained CNA, and can check patients vitals and provide medications. Patient shared that he does not feel he needs any additional assistance in the home. Spouse shared that she feels changing patients environment will not be good. Spouse shared that her PCP has suggested ALF placement for her as well, of which she has  declined.   Patient and wife did not have any concerns for palliative team. Supportive and reflective listening provided to spouse. Spouse shares that she has health issues herself. She shares that she has psychotherapy support monthly and additional telephonic support for her mental health needs. Palliative care will continue to follow.  3. PATIENT/CAREGIVER EDUCATION/ COPING:  Patient is alert, engaged. Wife shared that patient's agitation becomes agitated with her more often. Patient continues to smoke about one pack of cigarettes a day. Patient enjoys walking the dog. Patient said he has no interest in quitting. Stanley Jackson said that have limited family support. Patient declines any outings or psych support at this time.  4. PERSONAL EMERGENCY PLAN: patient to call 9-1-1 for emergencies.   5. COMMUNITY RESOURCES COORDINATION/ HEALTH CARE NAVIGATION:  Carolyn coordinates care.  6. FINANCIAL/LEGAL CONCERNS/INTERVENTIONS:  Patient has fixed income.     SOCIAL HX:  Social History   Tobacco Use  . Smoking status: Current Every Day Smoker    Packs/day: 0.50    Types: Cigarettes  . Smokeless tobacco: Never Used  Substance Use Topics  . Alcohol use: No    Comment: quit a few years ago    CODE STATUS: DNR  ADVANCED DIRECTIVES: Y MOST FORM COMPLETE: N HOSPICE EDUCATION PROVIDED: N  PPS: Patient independent with all ADL's. Patient does not drive.  Time spent: 50 min       [place], [place]

## 2021-06-03 DIAGNOSIS — F0391 Unspecified dementia with behavioral disturbance: Secondary | ICD-10-CM | POA: Diagnosis not present

## 2021-06-03 DIAGNOSIS — F32A Depression, unspecified: Secondary | ICD-10-CM | POA: Diagnosis not present

## 2021-06-03 DIAGNOSIS — Z23 Encounter for immunization: Secondary | ICD-10-CM | POA: Diagnosis not present

## 2021-06-03 DIAGNOSIS — J449 Chronic obstructive pulmonary disease, unspecified: Secondary | ICD-10-CM | POA: Diagnosis not present

## 2021-06-03 DIAGNOSIS — G250 Essential tremor: Secondary | ICD-10-CM | POA: Diagnosis not present

## 2021-06-03 DIAGNOSIS — E538 Deficiency of other specified B group vitamins: Secondary | ICD-10-CM | POA: Diagnosis not present

## 2021-06-03 DIAGNOSIS — R269 Unspecified abnormalities of gait and mobility: Secondary | ICD-10-CM | POA: Diagnosis not present

## 2021-06-03 DIAGNOSIS — Z9181 History of falling: Secondary | ICD-10-CM | POA: Diagnosis not present

## 2021-06-03 DIAGNOSIS — G6289 Other specified polyneuropathies: Secondary | ICD-10-CM | POA: Diagnosis not present

## 2021-06-06 DIAGNOSIS — F32A Depression, unspecified: Secondary | ICD-10-CM | POA: Diagnosis not present

## 2021-06-06 DIAGNOSIS — Z9181 History of falling: Secondary | ICD-10-CM | POA: Diagnosis not present

## 2021-06-06 DIAGNOSIS — H9190 Unspecified hearing loss, unspecified ear: Secondary | ICD-10-CM | POA: Diagnosis not present

## 2021-06-06 DIAGNOSIS — J449 Chronic obstructive pulmonary disease, unspecified: Secondary | ICD-10-CM | POA: Diagnosis not present

## 2021-06-06 DIAGNOSIS — E538 Deficiency of other specified B group vitamins: Secondary | ICD-10-CM | POA: Diagnosis not present

## 2021-06-06 DIAGNOSIS — F0281 Dementia in other diseases classified elsewhere with behavioral disturbance: Secondary | ICD-10-CM | POA: Diagnosis not present

## 2021-06-06 DIAGNOSIS — F419 Anxiety disorder, unspecified: Secondary | ICD-10-CM | POA: Diagnosis not present

## 2021-06-06 DIAGNOSIS — Z7982 Long term (current) use of aspirin: Secondary | ICD-10-CM | POA: Diagnosis not present

## 2021-06-06 DIAGNOSIS — G250 Essential tremor: Secondary | ICD-10-CM | POA: Diagnosis not present

## 2021-06-06 DIAGNOSIS — G6289 Other specified polyneuropathies: Secondary | ICD-10-CM | POA: Diagnosis not present

## 2021-06-23 ENCOUNTER — Telehealth: Payer: Self-pay

## 2021-06-23 NOTE — Telephone Encounter (Signed)
1:25 PM: Palliative care SW outreached patient/family for monthly telephonic visit and to schedule in person visit.  Call unsuccessful. SW left HIPPA compliant VM.  Awaiting return call. Will outreach again at later date and continue to offer palliative care support.

## 2021-06-30 DIAGNOSIS — J449 Chronic obstructive pulmonary disease, unspecified: Secondary | ICD-10-CM | POA: Diagnosis not present

## 2021-06-30 DIAGNOSIS — Z9181 History of falling: Secondary | ICD-10-CM | POA: Diagnosis not present

## 2021-06-30 DIAGNOSIS — F32A Depression, unspecified: Secondary | ICD-10-CM | POA: Diagnosis not present

## 2021-06-30 DIAGNOSIS — G6289 Other specified polyneuropathies: Secondary | ICD-10-CM | POA: Diagnosis not present

## 2021-06-30 DIAGNOSIS — Z7982 Long term (current) use of aspirin: Secondary | ICD-10-CM | POA: Diagnosis not present

## 2021-06-30 DIAGNOSIS — F419 Anxiety disorder, unspecified: Secondary | ICD-10-CM | POA: Diagnosis not present

## 2021-06-30 DIAGNOSIS — G250 Essential tremor: Secondary | ICD-10-CM | POA: Diagnosis not present

## 2021-06-30 DIAGNOSIS — H9190 Unspecified hearing loss, unspecified ear: Secondary | ICD-10-CM | POA: Diagnosis not present

## 2021-06-30 DIAGNOSIS — F0281 Dementia in other diseases classified elsewhere with behavioral disturbance: Secondary | ICD-10-CM | POA: Diagnosis not present

## 2021-06-30 DIAGNOSIS — E538 Deficiency of other specified B group vitamins: Secondary | ICD-10-CM | POA: Diagnosis not present

## 2021-07-06 ENCOUNTER — Other Ambulatory Visit: Payer: PPO

## 2021-07-06 ENCOUNTER — Other Ambulatory Visit: Payer: Self-pay

## 2021-07-06 VITALS — BP 90/60 | HR 50 | Temp 97.7°F | Resp 18

## 2021-07-06 DIAGNOSIS — Z515 Encounter for palliative care: Secondary | ICD-10-CM

## 2021-07-06 NOTE — Progress Notes (Signed)
COMMUNITY PALLIATIVE CARE SW NOTE  PATIENT NAME: Stanley Jackson DOB: [birth year] MRN: [id number]  PRIMARY CARE PROVIDER: Nelda Marseille, MD  RESPONSIBLE PARTY:  Acct ID - Guarantor Home Phone Work Phone Relationship Acct Type  [id number] DEMETRI, GOSHERT [phone] Self P/F     [place], [place] [address]     PLAN OF CARE and INTERVENTIONS:             GOALS OF CARE/ ADVANCE CARE PLANNING:  Patient is a DNR. Wife reports she has HCPOA. Goal is for patient to remain the home with wife as long as possible.   SOCIAL/EMOTIONAL/SPIRITUAL ASSESSMENT/ INTERVENTIONS:  SW and RN met with patient and Stanley Jackson (patient's wife) for monthly in home visit. Patient lives in apartment complex on first floor with spouse. Patient has progressing dementia.   RN reports no changes to SW at this. Stanley Jackson helped provide history and brief overview. Patient denies pain. No recent falls. [name] appetite is fair, Patient also drinks protein shakes. Patient enjoys chocolate and snacking throughout the day. Patient is sleeping well at night.    RN reviewed medications and took vitals. Patient currently receiving HH PT due to hx of falls and gait instability.  Spouse continues to be persistent of patient remaining in the home under her care. Spouse is aware of limites in home resources as well as ALF/ACH placement options, as PCP office SW has explained thoroughly on multiple occasions.   Patient and wife did not have any concerns for palliative team. Supportive and reflective listening provided to spouse. Palliative care will continue to follow.   PATIENT/CAREGIVER EDUCATION/ COPING:  Patient is alert, engaged. Wife shared that patient's agitation becomes agitated with her more often. Patient continues to smoke about one pack of cigarettes a day. Patient enjoys walking the dog. Patient said he has no interest in quitting. Stanley Jackson said that have limited family support. Patient declines any  outings or psych support at this time.  PERSONAL EMERGENCY PLAN: patient to call 9-1-1 for emergencies.    COMMUNITY RESOURCES COORDINATION/ HEALTH CARE NAVIGATION:  Stanley Jackson coordinates care.   6.         FINANCIAL/LEGAL CONCERNS/INTERVENTIONS:  Patient has fixed income.     SOCIAL HX:  Social History   Tobacco Use   Smoking status: Every Day    Packs/day: 0.50    Pack years: 0.00    Types: Cigarettes   Smokeless tobacco: Never  Substance Use Topics   Alcohol use: No    Comment: quit a few years ago    CODE STATUS: DNR ADVANCED DIRECTIVES: Y MOST FORM COMPLETE:  N HOSPICE EDUCATION PROVIDED: N  PPS: patient is supervision-MINA with ADL's.       Doreene Eland, [place]

## 2021-07-06 NOTE — Progress Notes (Signed)
PATIENT NAME: Stanley Jackson DOB: 07-09-1936 MRN: [id number]  PRIMARY CARE PROVIDER: Nelda Marseille, MD  RESPONSIBLE PARTY:  Acct ID - Guarantor Home Phone Work Phone Relationship Acct Type  [id number] Stanley Jackson, Stanley Jackson [phone] Self P/F     [name], [place] [address]    PLAN OF CARE and INTERVENTIONS:               1.  GOALS OF CARE/ ADVANCE CARE PLANNING:  Remain home with the assistance of his wife.               2.  PATIENT/CAREGIVER EDUCATION:  Safety               4. PERSONAL EMERGENCY PLAN:  Activate 911 for emergencies.               5.  DISEASE STATUS:  Patient answered the door and noted his wife was not feeling well and would not be able to participate in the visit.  Patient agreeable to continue with visit without his wife.    Patient reports sleeping well.  No issues with insomnia.  Occasionally napping but not on a regular basis.   Patient denies any recent falls.  States he is using his cane regularly.  Endorses home exercises on a regular basis.   Patient does not recall have PT services although last PCP note documents referral.  Patient states he is eating well.  Reports weight at 130 lbs.  States he is drinking ensure almost daily.    Stanley Jackson, PT with [facility] arrived on this visit.  PT confirms seeing patient for 3-4 weeks now.  Confirms no falls while under [facility].  PT noted patient did fall in the parking lot prior to admission to [facility] services.  PT has no concerns about patient at this time and reports patient has been compliant with exercises.   Visit completed to allow patient to start with PT.    HISTORY OF PRESENT ILLNESS:  85 year old male with Dementia.  Patient is being followed by Palliative Care monthly and PRN.  CODE STATUS: DNR ADVANCED DIRECTIVES: No MOST FORM: No PPS: 50%   PHYSICAL EXAM:   VITALS: Today's Vitals   07/06/21 1128  BP: 90/60  Pulse: (!) 50  Resp: 18  Temp: 97.7 F (36.5 C)  SpO2: 95%  PainSc: 0-No  pain    LUNGS: decreased breath sounds CARDIAC: Cor RRR}  EXTREMITIES: - for edema SKIN: Skin color, texture, turgor normal. No rashes or lesions or temperature normal  NEURO: positive for gait problems and memory problems   Time 1125 am-1140 am.       Stanley Burton, RN

## 2021-09-11 DIAGNOSIS — R269 Unspecified abnormalities of gait and mobility: Secondary | ICD-10-CM | POA: Diagnosis not present

## 2021-09-11 DIAGNOSIS — Z720 Tobacco use: Secondary | ICD-10-CM | POA: Diagnosis not present

## 2021-09-11 DIAGNOSIS — J449 Chronic obstructive pulmonary disease, unspecified: Secondary | ICD-10-CM | POA: Diagnosis not present

## 2021-09-11 DIAGNOSIS — G6289 Other specified polyneuropathies: Secondary | ICD-10-CM | POA: Diagnosis not present

## 2021-09-11 DIAGNOSIS — G250 Essential tremor: Secondary | ICD-10-CM | POA: Diagnosis not present

## 2021-09-11 DIAGNOSIS — Z23 Encounter for immunization: Secondary | ICD-10-CM | POA: Diagnosis not present

## 2021-09-11 DIAGNOSIS — F0391 Unspecified dementia with behavioral disturbance: Secondary | ICD-10-CM | POA: Diagnosis not present

## 2021-09-11 DIAGNOSIS — F32A Depression, unspecified: Secondary | ICD-10-CM | POA: Diagnosis not present

## 2021-09-11 DIAGNOSIS — Z9181 History of falling: Secondary | ICD-10-CM | POA: Diagnosis not present

## 2021-09-11 DIAGNOSIS — E538 Deficiency of other specified B group vitamins: Secondary | ICD-10-CM | POA: Diagnosis not present

## 2021-10-19 ENCOUNTER — Other Ambulatory Visit: Payer: Self-pay

## 2021-10-19 ENCOUNTER — Ambulatory Visit: Payer: PPO

## 2021-10-19 DIAGNOSIS — Z515 Encounter for palliative care: Secondary | ICD-10-CM

## 2021-10-19 NOTE — Progress Notes (Signed)
COMMUNITY PALLIATIVE CARE SW NOTE  PATIENT NAME: Stanley Jackson DOB: 10/15/1936 MRN: [id number]  PRIMARY CARE PROVIDER: Nelda Marseille, MD  RESPONSIBLE PARTY:  Acct ID - Guarantor Home Phone Work Phone Relationship Acct Type  [id number] [name], [name] [phone] Self P/F     [address] [place], [place], [place] [address]                                            Telephonic visit     11 AM: Palliative care SW outreached patient to complete telephonic visit.   Palliative care SW outreached patient to complete telephonic visit. [name] wife provided update on medical condition and/or changes. wife shares that she feels patient is "getting worse" and she is unsure of what to do. Wife states that patients cognition is worsening as well as his strength and mobility. =Wife states that they are getting to a point where they are no longer to care for each other.  Wife shares that patient had a fall this past Fri 10/21, due to his BS dropping. Wife was able to provide intervention s at home to raise BS, and patient declined to go to the hospital. Per wife patient sustained no injure from fall. Patient has a SPC and RW, but does not use them consistently.   Wife shares that patients appetite is the same/fair and she continues to make him chocolate shakes daily. No significant weight changes noted.   Wife shares that patients sleeping patterns seems to be off, as patient usually is not a late riser but patient is still in the bed during call (at 11am).   Wife reports no medication needs or adjustments noted.   Psychosocial assessment: Wife express some financial strain for caregiver support - this is an ongoing issue/concern for wife and PCP SW provided ample support, information and resources to wife for caregiver support to include Medicaid information, private caregiver agencies and placement options, all of which wife has not wish to pursue, no food insecurities or issues with  transportation noted. No S/S of depression or anxiety from patient per wife, however wife express extreme caregiver fatigue and her own MH concerns, of which she is addressing. Wife shared that her son will be visiting from [place] next week, and she is anxious about this visit as her son also has a lot anxiety and she fears it will trigger hers. SW will continue to monitor and provided support/assistance.   Palliative care will continue to monitor and assist with long term care planning as needed. Next in person visit scheduled with wife for 11/3 @1pm .  SOCIAL HX:  Social History   Tobacco Use   Smoking status: Every Day    Packs/day: 0.50    Types: Cigarettes   Smokeless tobacco: Never  Substance Use Topics   Alcohol use: No    Comment: quit a few years ago    CODE STATUS: DNR ADVANCED DIRECTIVES: Y MOST FORM COMPLETE:  Y HOSPICE EDUCATION PROVIDED: N       [place], LCSW

## 2021-10-29 ENCOUNTER — Other Ambulatory Visit: Payer: Self-pay

## 2021-10-29 ENCOUNTER — Other Ambulatory Visit: Payer: PPO

## 2021-10-29 VITALS — BP 110/60 | HR 50 | Temp 98.1°F | Resp 18

## 2021-10-29 DIAGNOSIS — Z515 Encounter for palliative care: Secondary | ICD-10-CM

## 2021-10-29 NOTE — Progress Notes (Signed)
PATIENT NAME: Stanley Jackson DOB: February 17, 1936 MRN: [id number]  PRIMARY CARE PROVIDER: Nelda Marseille, MD  RESPONSIBLE PARTY:  Acct ID - Guarantor Home Phone Work Phone Relationship Acct Type  [id number] Stanley Jackson [phone] Self P/F     [place], [place] [address]    PLAN OF CARE and INTERVENTIONS:               1.  GOALS OF CARE/ ADVANCE CARE PLANNING:  Remain home under the care of his wife.                2.  PATIENT/CAREGIVER EDUCATION:  Home Care Providers and safety.               4. PERSONAL EMERGENCY PLAN:  Activate 911 for emergencies.                5.  DISEASE STATUS:  Joint visit completed with [place], SW, patient and wife.  Patient walking in the home without assistive devices.  Fall reported last week at the doorway.  No injuries are noted by wife. She noted patient was hypotensive and shaky.  She provided liquids and a snack for patient.  Once symptoms improved she assisted patient back into the chair.    Wife offered to call 911 for assistance but patient declined.  Patient up ambulating without assistive devices.  Gait unsteady.  Using furniture, wall, etc to help stabilize his gait.   Discussed use of a walker/cane for safe ambulation.   Patient reports appetite has been good.  He and wife do not feel there has been any weight loss.  Patient is drinking a supplement drink at least daily.   Wife reports patient continues to have episodes of diarrhea.  He has been advised to take imodium but patient has refused to take this.  Wife discussed the challenges of caregiving, her health issues and difficulty getting patient to providers for check ups.  She is meeting with her insurance agent next week.  We discussed locating a provider who does home visits to decrease caregiver stress.  Wife will look further into this once she meets with her agent.     HISTORY OF PRESENT ILLNESS:  85 year old male with Dementia.  Patient is being followed by  Palliative Care every 4-8 weeks and PRN.   CODE STATUS: DNR ADVANCED DIRECTIVES: No MOST FORM: No PPS: 50%   PHYSICAL EXAM:   VITALS: Today's Vitals   10/29/21 1332  BP: 110/60  Pulse: (!) 50  Resp: 18  Temp: 98.1 F (36.7 C)  SpO2: 95%    LUNGS: clear to auscultation  CARDIAC: Cor RRR}  EXTREMITIES: - for edema SKIN: Skin color, texture, turgor normal. No rashes or lesions or normal  NEURO: positive for gait problems and memory problems       Lorenza Burton, RN

## 2021-10-29 NOTE — Progress Notes (Signed)
COMMUNITY PALLIATIVE CARE SW NOTE  PATIENT NAME: Stanley Jackson DOB: [birth year] MRN: [id number]  PRIMARY CARE PROVIDER: Nelda Marseille, MD  RESPONSIBLE PARTY:  Acct ID - Guarantor Home Phone Work Phone Relationship Acct Type  [id number] Stanley Jackson [phone] Self P/F     Stanley Jackson, Stanley Jackson [address]     PLAN OF CARE and INTERVENTIONS:             GOALS OF CARE/ ADVANCE CARE PLANNING:  : Patient is a DNR. Wife reports she has HCPOA. Goal is for patient to remain the home with wife as long as possible.   SOCIAL/EMOTIONAL/SPIRITUAL ASSESSMENT/ INTERVENTIONS:  SW and RN met with patient and Stanley Jackson (patient's wife) for monthly in home visit. Patient lives in apartment complex on first floor with spouse. Patient has progressing dementia.   Patient and wife updated PC team on medical update. Stanley Jackson, wife, helped provide history and brief overview. Patient denies pain. Patient has had a number of falls since last in person visit, most recent fall on Sun 10/25/21. No injuries sustained. Patient has a shuffle gait and unsteady on his feet. Fall and safety precautions discussed, patient encouraged to use cane and RW.  [name] appetite is fair, Patient also drinks protein shakes. Patient enjoys chocolate and snacking throughout the day. Patient is sleeping well at night, shares that he may be sleeping in a little more now.    RN reviewed medications and took vitals. Remote health services discussed with spouse and patient as a resource, as wife shared that it is becoming more difficult to get patient to agree to go to PCP office. Spouse shared that she and patient are meeting with an insurance agent next week to discuss possibly changing insurance providers and will outreach remote health after that meeting.   Spouse continues to be persistent of patient remaining in the home under her care. Spouse is aware of limits in home resources as well as ALF/ACH placement  options, as PCP office SW has explained thoroughly on multiple occasions.   Most of visit consumed with spouse sharing her medical and MH needs with PC team.   Patient and wife did not have any concerns for palliative team. Supportive and reflective listening provided to spouse. Palliative care will continue to follow.   PATIENT/CAREGIVER EDUCATION/ COPING:  Patient is alert, engaged. Wife shared that patient's agitation becomes agitated with her more often. Patient continues to smoke about one pack of cigarettes a day. Patient enjoys walking the dog. Patient said he has no interest in quitting. Stanley Jackson said that have limited family support. Patient declines any outings or psych support at this time.   PERSONAL EMERGENCY PLAN: patient to call 9-1-1 for emergencies.    5. COMMUNITY RESOURCES COORDINATION/ HEALTH CARE NAVIGATION:  Stanley Jackson coordinates care.   6.FINANCIAL/LEGAL CONCERNS/INTERVENTIONS:  Patient has fixed income     SOCIAL HX:  Social History   Tobacco Use   Smoking status: Every Day    Packs/day: 0.50    Types: Cigarettes   Smokeless tobacco: Never  Substance Use Topics   Alcohol use: No    Comment: quit a few years ago    CODE STATUS: DNR  ADVANCED DIRECTIVES: Y MOST FORM COMPLETE: N HOSPICE EDUCATION PROVIDED: N  PPS: Patient is supervision- Stanley Jackson with ADL's and needs. Patients insight and judgement is below average due to dementia dx.   Time spent: 50 min     Stanley Jackson, Stanley Jackson

## 2021-11-10 DIAGNOSIS — H2513 Age-related nuclear cataract, bilateral: Secondary | ICD-10-CM | POA: Diagnosis not present

## 2021-11-13 DIAGNOSIS — F03B18 Unspecified dementia, moderate, with other behavioral disturbance: Secondary | ICD-10-CM | POA: Diagnosis not present

## 2022-02-12 ENCOUNTER — Other Ambulatory Visit: Payer: Medicare Other

## 2022-02-12 ENCOUNTER — Other Ambulatory Visit: Payer: Self-pay

## 2022-02-12 DIAGNOSIS — Z515 Encounter for palliative care: Secondary | ICD-10-CM

## 2022-02-12 NOTE — Progress Notes (Signed)
PATIENT NAME: Stanley Jackson DOB: 03-27-1936 MRN: [id number]  PRIMARY CARE PROVIDER: Nelda Marseille, MD  RESPONSIBLE PARTY:  Acct ID - Guarantor Home Phone Work Phone Relationship Acct Type  [id number] [name], [name] [phone] Self P/F     [address] [place], [place], [place] [address]    Due to the COVID-19 crisis, this visit was done via telemedicine from my office and it was initiated and consent by this patient and or family.  I connected with  Stanley Jackson OR PROXY on 02/12/22 by telephone and verified that I am speaking with the correct person using two identifiers.   I discussed the limitations of evaluation and management by telemedicine. The patient expressed understanding and agreed to proceed.  PLAN OF CARE and INTERVENTIONS:               1.  GOALS OF CARE/ ADVANCE CARE PLANNING:  Remain home and independent with the assistance of his wife.               2.  PATIENT/CAREGIVER EDUCATION:  Safety and cough               4. PERSONAL EMERGENCY PLAN:  Activate 911 for emergencies.               5.  DISEASE STATUS:  connected with wife Stanley Jackson by telephone.  Stanley Jackson states patient was recently seen for chest x-ray related to coughing.  Results of x-ray were unremarkable.  Wife advises patient continues to smoke and has no desire to stop.  No falls are reported.  Weight gain of 5 lbs noted by wife.  Appetite has been good.  Patient is walking the dog to the mailbox on nice days.  We discussed safety.  Overall, wife has no new concerns for patient.  Palliative Care will see patient on 3/7 @ 12 for an in-person visit.    HISTORY OF PRESENT ILLNESS:  86 year old male with Dementia.  Patient is being followed by Palliative Care every 4-8 weeks and PRN.   CODE STATUS: DNR ADVANCED DIRECTIVES: No MOST FORM: No PPS: 50%        Lorenza Burton, RN

## 2022-03-02 ENCOUNTER — Other Ambulatory Visit: Payer: Medicare Other

## 2022-03-02 ENCOUNTER — Other Ambulatory Visit: Payer: Self-pay

## 2022-03-02 VITALS — BP 110/54 | Temp 98.5°F

## 2022-03-02 DIAGNOSIS — Z515 Encounter for palliative care: Secondary | ICD-10-CM

## 2022-03-02 NOTE — Progress Notes (Signed)
COMMUNITY PALLIATIVE CARE SW NOTE ? ?PATIENT NAME: Stanley Jackson ?DOB: 09/08/1936 ?MRN: [id number] ? ?PRIMARY CARE PROVIDER: Dale, Maureen Catherine, MD ? ?RESPONSIBLE PARTY:  ?Acct ID - Guarantor Home Phone Work Phone Relationship Acct Type  ?[id number] - Jurney,Abdalrahman L [phone] Self P/F  ?   [address], [place], [place] [address]  ? ? ? ?PLAN OF CARE and INTERVENTIONS:             ? Goals include to maximize quality of life and symptom management. Our advance care planning conversation included a discussion about:    ?The value and importance of advance care planning  ?Review and updating or creation of an advance directive document.  ?  Patient is a DNR. Patients spouse is HCPOA. ? ?2. Palliative care encounter: Palliative medicine team will continue to support patient, patient's family, and medic al team. Visit consisted of counseling and education dealing with the complex and emotionally intense issues of symptom management and palliative care in the setting of serious and potentially life-threatening illness. ? ?SW and RN met with patient and Stanley Jackson (patient's wife) for monthly in home visit. Patient lives in apartment complex on first floor with spouse. Patient has progressing dementia. ? ?Functional changes/updates: Patient has not had any functional changes since his recent PC visit. Patient was sitting outside at beginning of visit. Patient denies recent falls. Patient has a shuffle gait and unsteady on his feet. Fall and safety precautions discussed. Patient continues to use SPC to assist with ambulation.  Patient continues to be independent with all ADL's. ? ?Home & Environment assessment: No concerns. Patient feels safe in her home environment. Patients home is one level. Patient is able to maneuver around her home without issue.  ? ?Protein calorie malnutrition/weight loss/Appetite: [name] appetite is fair, Patient also drinks protein shakes. Patient enjoys chocolate and snacking throughout  the day. Patient is sleeping well at night, shares that he may be sleeping in a little more now.  ? Patient denies concerns with sleeping. ? ?Psychosocial assessment: completed. No additional physical needs identified at this time. Ongoing support/resources will continued to be offered if needed. Wife shares that she orders grocery to be delivered. Spouse also shared that she has a house keeper that comes every so often to clean. Spouse still drives and able to transport to patient to appointments. Patients daughter has offered to pay for private caregivers of patient and spouse do not feel they need at this time. Spouse also resources and information of ALF's, provided by PCP SW.  ? ?Military hx: N/A  ? ?Medical assessment: RN reviewed medications and took vitals.  ? ?Hospice discussion: N/A ? ?SW discussed goals, reviewed care plan, provided emotional support, used active and reflective listening in the form of reciprocity emotional response. Questions and concerns were addressed. The patient/family was encouraged to call with any additional questions and/or concerns. PC Provided general support and encouragement, no other unmet needs identified. Will continue to follow. ? ?3.         PATIENT/CAREGIVER EDUCATION/ COPING:   ?Appearance: well groomed, appropriate given situation  ?Mental Status: Alert/oriented. ?Eye Contact: Good. Able to engage in proper eye contact  ?Thought Process: rational  ?Thought Content: not assessed  ?Speech: Normal rate, volume, tone  ?Mood: Normal and calm ?Affect: Congruent to endorsed mood, full ranging ?Insight: normal ?Judgement: normal  ?Interaction Style: Cooperative ? ?Patient A&O, patient engaged in fluent conversation and answered all questions appropriately. Patients mood was jovial during visit   today. PHQ 9 is 0. Patient enjoys sitting outside and taking dog for walks.. ? ?4.         PERSONAL EMERGENCY PLAN:  Patient will call 9-1-1 for emergencies.   ? ?5.         COMMUNITY  RESOURCES COORDINATION/ HEALTH CARE NAVIGATION:  Patient and children manages his care. ? ?6.  FINANCIAL CONCERNS/NEEDS: None. ?  Primary Health Insurance: BCBS Medicare ?Secondary Health Insurance: N/A ?Prescription Coverage: Yes, no history of difficulty obtaining or affording prescriptions reported ?   ? ?SOCIAL HX:  ?Social History  ? ?Tobacco Use  ? Smoking status: Every Day  ?  Packs/day: 0.50  ?  Types: Cigarettes  ? Smokeless tobacco: Never  ?Substance Use Topics  ? Alcohol use: No  ?  Comment: quit a few years ago  ? ? ?CODE STATUS: DNR  ?ADVANCED DIRECTIVES: Y ?MOST FORM COMPLETE:  N ?HOSPICE EDUCATION PROVIDED: N ? ?PPS: Patient is INDP with all ADL's at this time. Patient is A&O  with poor-fair insight and judgement.  ? ? ?Time spent: 45 min  ? ? ? ? ? ? , LCSW ? ?

## 2022-03-02 NOTE — Progress Notes (Signed)
PATIENT NAME: Stanley Jackson ?DOB: [birth year] ?MRN: [id number] ? ?PRIMARY CARE PROVIDER: Nelda Marseille, MD ? ?RESPONSIBLE PARTY:  ?Acct ID - Guarantor Home Phone Work Phone Relationship Acct Type  ?[id number] BARNABAS, HENRIQUES [phone] Self P/F  ?   [facility] [place], [place],  [address]  ? ? ?PLAN OF CARE and INTERVENTIONS: ?              1.  GOALS OF CARE/ ADVANCE CARE PLANNING:  Patient and wife desire to remain home and independent for as long as possible. ?              2.  PATIENT/CAREGIVER EDUCATION:  Safety ?              4. PERSONAL EMERGENCY PLAN:  Activate 911 for emergencies.  ?              5.  DISEASE STATUS: ? ?Caregiver Fatigue:  Diane shares the stresses of caregiving and complicated family dynamics.  Patient and spouse advise they do not wish to enter ALF despite recommendations from PCP and SW with PCP office.   Private duty assistance was offered by patient's daughter but this caused significant tensions within the family.  At this time, the only assistance in the home is housekeeping every 2 weeks per Diane.  ? ?Dementia:  Patient with a slow cognitive decline mostly with short term memory.  Continues to complete ADL's independently.  No issues with urinary or bowel incontinence.  Wife endorses occasional angry outbursts in patient but this settles when she steps away.   ? ?Mobility:  Patient is using a cane for safe ambulation.  Shuffling gait present. No recent falls reported by patient or wife.  Continue to re-enforce safety as patient is a fall risk.  ? ?Weight:  Diane endorses patient is eating well and continues with supplement drink daily.  Patient is snacking throughout the day and eating 2-3 meals daily. ? ? ?HISTORY OF PRESENT ILLNESS:  86 year old male with Dementia.  Patient is being followed by Palliative Care every 4-8 weeks and PRN.  ? ?CODE STATUS: DNR ?ADVANCED DIRECTIVES: No ?MOST FORM: No ?PPS: 50% ? ? ?PHYSICAL EXAM:  ? ?VITALS: ?Today's Vitals  ? 03/02/22  1205  ?BP: (!) 110/54  ?Temp: 98.5 ?F (36.9 ?C)  ?SpO2: 94%  ?PainSc: 0-No pain  ?  ?LUNGS: clear to auscultation , decreased breath sounds to the bases ?CARDIAC: Cor RRR}  ?EXTREMITIES: - for edema ?SKIN: Skin color, texture, turgor normal. No rashes or lesions or mobility and turgor normal  ?NEURO: positive for dizziness and gait problems ? ? ? ? ? ? ?Lorenza Burton, RN ? ?

## 2022-05-19 ENCOUNTER — Other Ambulatory Visit: Payer: Medicare Other

## 2022-05-19 DIAGNOSIS — Z515 Encounter for palliative care: Secondary | ICD-10-CM

## 2022-05-19 NOTE — Progress Notes (Signed)
PATIENT NAME: Stanley Jackson DOB: 01-20-1936 MRN: [id number]  PRIMARY CARE PROVIDER: Nelda Marseille, MD  RESPONSIBLE PARTY:  Acct ID - Guarantor Home Phone Work Phone Relationship Acct Type  [id number] KYZER, BLOWE [phone] Self P/F     [address] [place], [place], [place] [address]       Connected with wife [name] to complete a telephonic visit and schedule a home visit for June.  Per wife, patient is falling more often and legs have become weaker.  Patient is not using his assistive devices as he should due to memory issues.  Wife advised PCP office had a nurse do a home visit to check on patient.  Coordination with PCP office and [facility] has resulted in [facility] referral that should be starting in the next 1-2 weeks.  [name] advises this will allow patient to have a PT, Nurse and aide to come into the home to assist patient.  Wife advised she is also needing more support with home chores and is hopeful this service will also be able to assist.  Wife is agreeable to a home visit on 05/27/22 @ 1030 am.   Lorenza Burton, RN

## 2022-05-26 ENCOUNTER — Telehealth: Payer: Self-pay

## 2022-05-26 NOTE — Telephone Encounter (Signed)
1230 pm.  Return call made to [facility] with [facility].  They are currently involved with patient and needed confirmation that patient was not under hospice.  Confirmation provided and advised patient is under Palliative Care.

## 2022-05-27 ENCOUNTER — Other Ambulatory Visit: Payer: Medicare Other | Admitting: Primary Care

## 2022-05-27 ENCOUNTER — Other Ambulatory Visit: Payer: Medicare Other

## 2022-05-27 ENCOUNTER — Telehealth: Payer: Self-pay

## 2022-05-27 VITALS — BP 108/50 | HR 44 | Temp 98.4°F | Resp 22

## 2022-05-27 DIAGNOSIS — Z515 Encounter for palliative care: Secondary | ICD-10-CM

## 2022-05-27 DIAGNOSIS — R413 Other amnesia: Secondary | ICD-10-CM

## 2022-05-27 DIAGNOSIS — F03B18 Unspecified dementia, moderate, with other behavioral disturbance: Secondary | ICD-10-CM

## 2022-05-27 DIAGNOSIS — Z9181 History of falling: Secondary | ICD-10-CM

## 2022-05-27 DIAGNOSIS — J449 Chronic obstructive pulmonary disease, unspecified: Secondary | ICD-10-CM

## 2022-05-27 NOTE — Telephone Encounter (Signed)
Na

## 2022-05-27 NOTE — Progress Notes (Signed)
COMMUNITY PALLIATIVE CARE SW NOTE  PATIENT NAME: Stanley Jackson DOB: May 21, 1936 MRN: [id number]  PRIMARY CARE PROVIDER: Nelda Marseille, MD  RESPONSIBLE PARTY:  Acct ID - Guarantor Home Phone Work Phone Relationship Acct Type  [id number] Stanley Jackson [phone] Self P/F     [place], [place] [address]     PLAN OF CARE and INTERVENTIONS:            GOALS OF CARE/ ADVANCE CARE PLANNING: Goals include to maximize quality of life and symptom management. Our advance care planning conversation included a discussion about:    The value and importance of advance care planning  Review and updating or creation of an advance directive document.                          Patient is a DNR. Patients spouse is HCPOA. MOST form completed during visits with comfort measures identified    PC SW and RN completed follow up in home PC visit with patient. Patient lives in apartment with spouse.  PC NP - K. Tamala Julian connect via [employer].   Functional changes/updates: Patient has had a functional decline since previous PC visit. Spouse shares that patient is having more difficulty with STS from his recliner chair, they implemented use of additional cushions to assist. Patient is semi-independent with ADLS. Spouse shares that patients memory is worsening. Patient has had multiple falls, patient does not use AD. Safety precautions and fall risk discussed. Marland Kitchen    Psychosocial assessment: completed.   Home support: Patient is receiving [place] services from [facility]. Respite discussed with spouse. Spouse shared that patient will not agree to go to a facility for a short respite stay and that she has a list of home care agencies that offer respite as well as contact info for [facility].  Transportation: no needs.   Food: No needs.   Behaviors: none reported.  Ongoing support/resources will continued to be offered if needed.   SW discussed goals, reviewed care plan,  provided emotional support, used active and reflective listening in the form of reciprocity emotional response. Family wishes to keep patient in the home for as long as possible, but expresses fatigue and need for additional assistance. Questions and concerns were addressed. The patient/family was encouraged to call with any additional questions and/or concerns. PC Provided general support and encouragement, no other unmet needs identified. Will continue to follow.  3.         PATIENT/CAREGIVER EDUCATION/ COPING:   Appearance: well groomed, appropriate given situation  Mental Status: Alert/oriented to self Eye Contact: Good. Able to engage in proper eye contact  Thought Process: rational  Thought Content: not assessed  Speech: Normal rate, volume, tone  Mood: Normal and calm Affect: Congruent to endorsed mood, full ranging Insight: fair Judgement: poor  Interaction Style: Cooperative   Patient A&O with some confusion, patient engaged in minimal conversation and answered all questions to his best ability. Patients mood was jovial during visit today. PHQ 9 is 0. Patient enjoys sitting outside and taking dog for walks.  Patient spouse suffers from depression. SW advised she outreaches her [place] provider for additional support. As she expressed lack of support and feeling sad and overwhelmed often.    4.         PERSONAL EMERGENCY PLAN:  Patient/spouse will call 9-1-1 for emergencies.    5.  COMMUNITY RESOURCES COORDINATION/ HEALTH CARE NAVIGATION:  spouse manages his care.  6.         FINANCIAL CONCERNS/NEEDS: None.                         Primary Health Insurance: [employer] Secondary Health Insurance: N/A                         Prescription Coverage: Yes, no history of difficulty obtaining or affording prescriptions reported      SOCIAL HX:  Social History   Tobacco Use   Smoking status: Every Day    Packs/day: 0.50    Types: Cigarettes   Smokeless tobacco: Never   Substance Use Topics   Alcohol use: No    Comment: quit a few years ago    CODE STATUS: DNR  ADVANCED DIRECTIVES: Y MOST FORM COMPLETE:  Y HOSPICE EDUCATION PROVIDED: N  PPS: Patient is MIN-Supervision with all ADL's.    Time spent: 1 hr      [place], [place]

## 2022-05-27 NOTE — Progress Notes (Signed)
[facility] Palliative Care Consult Note Telephone: [phone]  Fax: [phone]   Date of encounter: 05/27/22 10:41 AM PATIENT NAME: Stanley Jackson [address] Stanley Jackson [place] [address]   [phone] (home)  DOB: 1936/12/13 MRN: [id number] PRIMARY CARE PROVIDER:    Nelda Marseille, MD,  [address] [place] [address] [phone]  REFERRING PROVIDER:   Nelda Marseille, MD [address] [place],  [place] [address] [phone]  RESPONSIBLE PARTY:    Contact Information     Name Relation Home Work [place] [phone]   Carrie, Schoonmaker Daughter   [phone]   Argentina Donovan   [phone]     I connected with  Stanley Jackson on 05/27/22 by a video enabled telemedicine application and verified that I am speaking with the correct person using two identifiers.   I discussed the limitations of evaluation and management by telemedicine. The patient expressed understanding and agreed to proceed.    I met face to face with patient and family in the home connecting virtually with Vance Gather, RN.  Palliative Care was asked to follow this patient by consultation request of  Nelda Marseille* to address advance care planning and complex medical decision making. This is the initial visit.                                     ASSESSMENT AND PLAN / RECOMMENDATIONS:   Advance Care Planning/Goals of Care: Goals include to maximize quality of life and symptom management. Patient/health care surrogate gave his/her permission to discuss.Our advance care planning conversation included a discussion about:    The value and importance of advance care planning  Exploration of personal, cultural or spiritual beliefs that might influence medical decisions  Exploration of goals of care in the event of a sudden injury  or illness  Identification  of a healthcare agent-Stanley Jackson Review of an  advance directive document. Decision not to resuscitate in place. CODE STATUS:  DNR ACP:  Reviewed GOC with patient and wife.  MOST form completed today.  DNR, Comfort Measures, IVF if indicated, ABT if indicated, no tube feeding.  MOST form to be uploaded in [facility].  I completed a MOST form today. The patient and family outlined their wishes for the following treatment decisions:  Cardiopulmonary Resuscitation: Do Not Attempt Resuscitation (DNR/No CPR)  Medical Interventions: Comfort Measures: Keep clean, warm, and dry. Use medication by any route, positioning, wound care, and other measures to relieve pain and suffering. Use oxygen, suction and manual treatment of airway obstruction as needed for comfort. Do not transfer to the hospital unless comfort needs cannot be met in current location.  Antibiotics: Antibiotics if indicated  IV Fluids: IV fluids if indicated  Feeding Tube: No feeding tube   I spent 20 minutes providing this consultation. More than 50% of the time in this consultation was spent in counseling and care coordination. -----------------------------------------------------------------------------------------------------------------------  Symptom Management/Plan:  Cardiac:  Apical heart rate at 44 initially.  Rechecked apical heart rate 45 minutes later and rate is at 56.  Patient denies any issues with syncopal episodes or dizziness. Discussed possible diagnostics and interventions. Wife repeatedly opted for conservative measures, especially if patient was not having untoward symptoms.   Debility: Patient with increase lower extremity weakness.  Most recent fall 4 days when patient fell outside by the back patio.  No injuries reported.  Patient is currently under [facility] PT/OT/NSG/ ST per wife.   Patient has a rolling walker but endorses he is not using it.  We discussed safety and completing  exercises that were given to patient by therapy to strengthen lower extremities.  Patient now has 2 cushions in his recliner chair as he is needing more assistance to stand.  Gait is shuffling and patient is furniture/wall surfing.  Dementia:  Patient is alert to self and wife.  Unable to recall date or current location.  Patient is able to feed himself and wife is doing meal preparations.  Patient is ambulatory and would benefit from using his walker but does not always remember to do so.   Patient is able to shower and dress himself.  He does need some cueing for personal hygiene.  Wife endorses patient is no longer remembering to feed their dog Stanley Jackson.   Patient pleasant throughout the visit, only speaking when spoken to.  Medication Management:  Medications updated based on most recent PCP visit on 03/24/22.  Wife is utilizing a pill box and fills this weekly for patient.   Follow up Palliative Care Visit: Palliative care will continue to follow for complex medical decision making, advance care planning, and clarification of goals. Return 6 weeks or prn.  This visit was coded based on medical decision making (MDM).  PPS: 40%  HOSPICE ELIGIBILITY/DIAGNOSIS: TBD  Chief Complaint: debility, immobility, dementia  HISTORY OF PRESENT ILLNESS:  Stanley Jackson is a 86 y.o. year old male  with Dementia, COPD, cognitive impairment, neuropathy. History obtained from review of EMR, discussion with primary team, and interview with family, facility staff/caregiver and/or Mr. Locy.  I reviewed available labs, medications, imaging, studies and related documents from the EMR.  Records reviewed and summarized above.   ROS  General: NAD EYES: denies vision changes ENMT: denies dysphagia Cardiovascular: denies chest pain, + DOE Pulmonary: + cough, denies increased SOB Abdomen: endorses good appetite, denies constipation, endorses continence of bowel GU: denies dysuria, endorses continence of urine MSK:  +  increased weakness,  + falls reported Skin: denies rashes or wounds Neurological: denies pain, denies insomnia Psych: Endorses positive mood Heme/lymph/immuno: denies bruises, abnormal bleeding  Physical Exam: Current and past weights: 139 lbs per last PCP visit Constitutional: NAD General: frail and thin appearing male EYES: anicteric sclera, lids intact, no discharge  ENMT: intact hearing, oral mucous membranes moist, dentition intact CV:  no LE edema Pulmonary: no increased work of breathing, no cough, room air Abdomen: intake 50-75%,  no ascites GU: deferred MSK: + sarcopenia, moves all extremities, ambulatory Skin: warm and dry, no rashes or wounds on visible skin Neuro:  +  generalized weakness,  + cognitive impairment Psych: non-anxious affect, A and O x 1 Hem/lymph/immuno: no widespread bruising CURRENT PROBLEM LIST:  Patient Active Problem List   Diagnosis Date Noted   Risk for falls 10/31/2020   Moderate dementia with behavioral disturbance ([place]) 07/25/2020   Mood and affect disturbance 01/21/2020   Loss of memory 08/23/2019   Depression, prolonged 09/26/2018   Abnormality of gait 01/11/2017   Benign essential tremor 01/11/2017   Neuropathy, peripheral 01/11/2017   Moderate COPD (chronic obstructive pulmonary disease) ([facility]) 07/09/2014    PAST MEDICAL HISTORY:  Active Ambulatory Problems    Diagnosis Date Noted   Abnormality of gait 01/11/2017   Benign essential tremor 01/11/2017   Depression,  prolonged 09/26/2018   Loss of memory 08/23/2019   Moderate COPD (chronic obstructive pulmonary disease) ([place]) 07/09/2014   Moderate dementia with behavioral disturbance ([facility]) 07/25/2020   Mood and affect disturbance 01/21/2020   Neuropathy, peripheral 01/11/2017   Risk for falls 10/31/2020   Resolved Ambulatory Problems    Diagnosis Date Noted   No Resolved Ambulatory Problems   No Additional Past Medical History    SOCIAL HX:  Social History   Tobacco Use    Smoking status: Every Day    Packs/day: 0.50    Types: Cigarettes   Smokeless tobacco: Never  Substance Use Topics   Alcohol use: No    Comment: quit a few years ago   FAMILY HX: No family history on file.    ALLERGIES:   Allergies  Allergen Reactions   Alprazolam Other (See Comments)   Valproic Acid Other (See Comments)    PERTINENT MEDICATIONS:  Outpatient Encounter Medications as of 05/27/2022  Medication Sig   DimenhyDRINATE (DRAMAMINE PO) Take by mouth. Takes at night   donepezil (ARICEPT) 5 MG tablet Take 5 mg by mouth at bedtime.   gabapentin (NEURONTIN) 100 MG capsule Take 100 mg by mouth 3 (three) times daily.   QUEtiapine (SEROQUEL) 25 MG tablet Take 25 mg by mouth at bedtime.   sertraline (ZOLOFT) 50 MG tablet Take 50 mg by mouth daily.   umeclidinium-vilanterol (ANORO ELLIPTA) 62.5-25 MCG/INH AEPB Inhale 1 puff into the lungs daily.   No facility-administered encounter medications on file as of 05/27/2022.   Thank you for the opportunity to participate in the care of Mr. Geter.  The palliative care team will continue to follow. Please call our office at [phone] if we can be of additional assistance.   Lorenza Burton, RN ,   Jason Coop DNP, MPH, AGPCNP-BC, [facility]  COVID-19 PATIENT SCREENING TOOL Asked and negative response unless otherwise noted:  Have you had symptoms of covid, tested positive or been in contact with someone with symptoms/positive test in the past 5-10 days? No

## 2022-07-28 ENCOUNTER — Other Ambulatory Visit: Payer: Medicare Other | Admitting: Primary Care

## 2022-07-28 DIAGNOSIS — F03B18 Unspecified dementia, moderate, with other behavioral disturbance: Secondary | ICD-10-CM

## 2022-07-28 DIAGNOSIS — J449 Chronic obstructive pulmonary disease, unspecified: Secondary | ICD-10-CM

## 2022-07-28 DIAGNOSIS — Z515 Encounter for palliative care: Secondary | ICD-10-CM

## 2022-07-28 DIAGNOSIS — Z9181 History of falling: Secondary | ICD-10-CM

## 2022-07-28 DIAGNOSIS — R269 Unspecified abnormalities of gait and mobility: Secondary | ICD-10-CM

## 2022-07-28 DIAGNOSIS — R972 Elevated prostate specific antigen [PSA]: Secondary | ICD-10-CM

## 2022-07-28 NOTE — Progress Notes (Signed)
[facility] Pall[facility] Care Consult Note Telephone: [phone]  Fax: [phone]    Date of encounter: 07/28/22 1:43 PM PATIENT NAME: Stanley Jackson [address] [place] [address]   [phone] (home)  DOB: 01-Dec-1936 MRN: [id number] PRIMARY CARE PROVIDER:    Tracie Harrier, [place] [address] [phone]   REFERRING PROVIDER:   Tracie Harrier, [place] [address] [phone]  RESPONSIBLE PARTY:    Contact Information     Name Relation Home Work [place]   RAFFAELE, DERISE [phone]   Danner, Paulding Daughter   [phone]   Argentina Donovan   [phone]       I met face to face with patient and family in  home. Palliative Care was asked to follow this patient by consultation request of  Tracie Harrier, MD to address advance care planning and complex medical decision making. This is a follow up visit.                                   ASSESSMENT AND PLAN / RECOMMENDATIONS:   Advance Care Planning/Goals of Care: Goals include to maximize quality of life and symptom management. Patient/health care surrogate gave his/her permission to discuss.Our advance care planning conversation included a discussion about:     Exploration of personal, cultural or spiritual beliefs that might influence medical decisions  Exploration of goals of care in the event of a sudden injury or illness  Identification of a healthcare agent - wife Does not qualify for medicaid per wife. Wife voices extreme caregiver burden. Discussed placement with wife who states patient is not cooperative. Feels doctor will place patient, we reviewed process that she should initiate desire and PCP and consultants will add information and help with documentation. POA voices need for own mental health services and  support New Dx of elevated PSA , will consult with urology. Review of an  advance directive document - MOST in home, uploaded to [place]: DNR  I reviewed a MOST form today. The patient and family outlined their wishes for the following treatment decisions:  Cardiopulmonary Resuscitation: Do Not Attempt Resuscitation (DNR/No CPR)  Medical Interventions: Comfort Measures: Keep clean, warm, and dry. Use medication by any route, positioning, wound care, and other measures to relieve pain and suffering. Use oxygen, suction and manual treatment of airway obstruction as needed for comfort. Do not transfer to the hospital unless comfort needs cannot be met in current location.  Antibiotics: Antibiotics if indicated  IV Fluids: IV fluids if indicated  Feeding Tube: No feeding tube   I spent 20 minutes providing this consultation. More than 50% of the time in this consultation was spent in counseling and care coordination. ------------------------------------------------------------------------------------------------------  Elevated PSA: no change in Urine function; PSA  = > 150. Denies hesitancy, denies pain. Wife states this finding is new, < 2 weeks. Has consult with urology. States they do not plan to treat but staging would be helpful for care planning  Dementia: Endorses agitation and acting out. I recommend addressing the agitation and he has some wandering behaviors. He has been on Seroquel in the past and was taken off by [facility].  Has seen Dr. Melrose Nakayama, who recommended memory care. I am requesting they return for up to date assessment and re evaluation of care plan  and discussion of medication regimen with neurology, and w/u in light of his bradycardia episodes.  Caregiver strain: Discussed their needs, for care giving and decision making.  She is not sure she can make his decisions. Discussed capacity, does not have advance directives, discussed legal guardian.  She may need guidance in  making decisions on his behalf.    Follow up Palliative Care Visit: Palliative care will continue to follow for complex medical decision making, advance care planning, and clarification of goals. Return 6 weeks or prn.  This visit was coded based on medical decision making (MDM).  PPS: 40%  HOSPICE ELIGIBILITY/DIAGNOSIS: TBD  Chief Complaint: agitation, dementia with behavior disturbances.  HISTORY OF PRESENT ILLNESS:  Stanley Jackson is a 86 y.o. year old male  with COPD, debility, immobility, fall risk, dementia with behavior disturbances. Patient seen today to review palliative care needs to include medical decision making and advance care planning as appropriate.   History obtained from review of EMR, discussion with primary team, and interview with family, facility staff/caregiver and/or Mr. Dutton.  I reviewed available labs, medications, imaging, studies and related documents from the EMR.  Records reviewed and summarized above.   ROS   General: NAD EYES: denies vision changes, has glasses ENMT: denies dysphagia Cardiovascular: denies chest pain, denies DOE Pulmonary: denies cough, denies increased SOB Abdomen: endorses good appetite, denies constipation, endorses continence of bowel GU: denies dysuria, endorses continence of urine MSK:  endorses   increased weakness,  + falls reported Skin: denies rashes or wounds Neurological: denies pain, denies insomnia Psych: Endorses positive mood  Physical Exam: Current and past weights: stated 138 lbs Constitutional: NAD General: frail appearing, thin EYES: anicteric sclera, lids intact, no discharge  ENMT: intact hearing, oral mucous membranes moist, dentition intact CV: RRR, no LE edema HR 75 RR 18 Pulmonary: no increased work of breathing, no cough, room air, daily smoker Abdomen: intake 80%,  no ascites MSK: +sarcopenia, moves all extremities, ambulatory with cane and walker, difficult to rise, fall risk. Skin: warm and dry, no  rashes or wounds on visible skin Neuro: + generalized weakness, + cognitive impairment, + anxious affect   Thank you for the opportunity to participate in the care of Mr. Klaiber.  The palliative care team will continue to follow. Please call our office at [phone] if we can be of additional assistance.   Jason Coop, NP DNP, AGPCNP-BC  COVID-19 PATIENT SCREENING TOOL Asked and negative response unless otherwise noted:   Have you had symptoms of covid, tested positive or been in contact with someone with symptoms/positive test in the past 5-10 days?

## 2022-08-03 ENCOUNTER — Other Ambulatory Visit: Payer: Medicare Other

## 2022-08-03 DIAGNOSIS — Z515 Encounter for palliative care: Secondary | ICD-10-CM

## 2022-08-03 NOTE — Progress Notes (Signed)
PATIENT NAME: Stanley Jackson DOB: [birth year] MRN: [id number]  PRIMARY CARE PROVIDER: Tracie Harrier, MD  RESPONSIBLE PARTY:  Acct ID - Guarantor Home Phone Work Phone Relationship Acct Type  [id number] HY, SW[employer] [phone]  Self P/F     [address], [place], [place] [address]   I connected with  Jaquell L Yaffe/Carolyn Sisley on 08/03/22 by a telephone and verified that I am speaking with the correct person using two identifiers.   I discussed the limitations of evaluation and management by telemedicine. The patient expressed understanding and agreed to proceed.   Follow up call completed with wife [name] to check on patient status after a fall on Sunday.  [name] advises patient is very forgetful and is not remembering to feed their dog.  He is refusing to eat at times and he is yelling at her asking what he did wrong.   No further falls have occurred.  Wife advised she has contacted DSS for assistance as they have come to the home in the past.  She has also contacted patient's PCP for further guidance.  Team updated on this call.    CODE STATUS: DNR ADVANCED DIRECTIVES: not on file MOST FORM: Yes PPS: 50%       Lorenza Burton, RN

## 2022-08-05 ENCOUNTER — Ambulatory Visit: Payer: Medicare Other | Admitting: Urology

## 2022-08-05 ENCOUNTER — Encounter: Payer: Self-pay | Admitting: Urology

## 2022-08-05 VITALS — BP 92/62 | HR 86 | Ht 70.0 in | Wt 133.0 lb

## 2022-08-05 DIAGNOSIS — R972 Elevated prostate specific antigen [PSA]: Secondary | ICD-10-CM

## 2022-08-05 DIAGNOSIS — C61 Malignant neoplasm of prostate: Secondary | ICD-10-CM

## 2022-08-05 NOTE — Progress Notes (Unsigned)
08/05/2022 1:46 PM   Stanley Jackson Jul 20, 1936 [id number]  Referring provider: Tracie Harrier, MD [address] [facility] [place],  [place] [address]  Chief Complaint  Patient presents with   Elevated PSA    HPI: Stanley Jackson is a 86 y.o. male with dementia referred for evaluation of an elevated PSA.  He presents with his wife who is his healthcare power of attorney.  Initial visit Dr. Ginette Pitman 07/19/2022.   PSA drawn which was >39 Wife states he has chronic urinary incontinence.  No gross hematuria or recurrent UTI.  Is taking tamsulosin No bony pain   PMH: No past medical history on file. ***  Surgical History: No past surgical history on file.  Home Medications:  Allergies as of 08/05/2022       Reactions   Alprazolam Other (See Comments)   Valproic Acid Other (See Comments)        Medication List        Accurate as of August 05, 2022  1:46 PM. If you have any questions, ask your nurse or doctor.          STOP taking these medications    donepezil 5 MG tablet Commonly known as: ARICEPT Stopped by: Abbie Sons, MD   DRAMAMINE PO Stopped by: Abbie Sons, MD   gabapentin 100 MG capsule Commonly known as: NEURONTIN Stopped by: Abbie Sons, MD   QUEtiapine 25 MG tablet Commonly known as: SEROQUEL Stopped by: Abbie Sons, MD       TAKE these medications    albuterol 108 (90 Base) MCG/ACT inhaler Commonly known as: VENTOLIN HFA Inhale 2 puffs into the lungs every 6 (six) hours as needed for wheezing or shortness of breath.   Anoro Ellipta 62.5-25 MCG/ACT Aepb Generic drug: umeclidinium-vilanterol Inhale 1 puff into the lungs daily.   aspirin EC 81 MG tablet Take 81 mg by mouth daily. Swallow whole.   loperamide 2 MG capsule Commonly known as: IMODIUM Take 2 mg by mouth as needed for diarrhea or loose stools. 1 tablet by mouth 4 x daily as needed for diarrhea   mirtazapine 15 MG tablet Commonly known as:  REMERON Take 15 mg by mouth at bedtime.   sertraline 100 MG tablet Commonly known as: ZOLOFT Take 100 mg by mouth daily.   tamsulosin 0.4 MG Caps capsule Commonly known as: FLOMAX Take 0.4 mg by mouth daily.        Allergies:  Allergies  Allergen Reactions   Alprazolam Other (See Comments)   Valproic Acid Other (See Comments)    Family History: No family history on file.  Social History:  reports that he has been smoking cigarettes. He has been smoking an average of .5 packs per day. He has never used smokeless tobacco. He reports that he does not drink alcohol and does not use drugs.   Physical Exam: BP 92/62   Pulse 86   Ht '5\' 10"'$  (1.778 m)   Wt 133 lb (60.3 kg)   BMI 19.08 kg/m   Constitutional:  Alert, No acute distress. HEENT: [place] AT. Respiratory: Normal respiratory effort, no increased work of breathing. Skin: No rashes, bruises or suspicious lesions.    Assessment & Plan:    1.  Elevated PSA Marked PSA elevation suspicious for prostate cancer Urinalysis at the time of blood draw was negative Schedule CT abdomen/pelvis to evaluate for adenopathy and bony mets of spine/pelvis We discussed prostate biopsy and if they desire  to pursue would recommend doing under sedation.  Potential risks/complications were discussed including bleeding and infection/sepsis Will call with CT results and his wife will think over pursuing prostate biopsy ***order We also discussed the recommended treatment would be ADT   Abbie Sons, MD  [facility] [address], [place], [place] [address] [phone]

## 2022-08-10 ENCOUNTER — Encounter: Payer: Self-pay | Admitting: Urology

## 2022-08-12 ENCOUNTER — Encounter: Payer: Self-pay | Admitting: Urology

## 2022-08-18 ENCOUNTER — Ambulatory Visit
Admission: RE | Admit: 2022-08-18 | Discharge: 2022-08-18 | Disposition: A | Discharge status: Home / Self Care | Payer: Medicare Other | Source: Ambulatory Visit | Attending: Urology | Admitting: Urology

## 2022-08-18 DIAGNOSIS — C61 Malignant neoplasm of prostate: Secondary | ICD-10-CM | POA: Insufficient documentation

## 2022-08-18 MED ORDER — IOHEXOL 300 MG/ML  SOLN
100.0000 mL | Freq: Once | INTRAMUSCULAR | Status: AC | PRN
  Administered 2022-08-18: 100 mL via INTRAVENOUS

## 2022-08-20 ENCOUNTER — Encounter: Payer: Self-pay | Admitting: Urology

## 2022-08-30 ENCOUNTER — Encounter: Payer: Self-pay | Admitting: Urology

## 2022-09-08 ENCOUNTER — Telehealth: Payer: Self-pay

## 2022-09-08 ENCOUNTER — Telehealth: Payer: Self-pay | Admitting: Urology

## 2022-09-08 NOTE — Telephone Encounter (Signed)
Amber called to verify referral/order for Hospice via  Fax [phone].  Did we receive it? Please call Amber at [phone].

## 2022-09-08 NOTE — Telephone Encounter (Signed)
Talked with Amber today and states we did not send a referral over. I advised her to check with Dr. Ginette Pitman office .

## 2022-09-08 NOTE — Telephone Encounter (Signed)
PC SW connected with patients spouse, Stanley Jackson. Who shared that she was in need of in home resources for patient.  Spouse shared that patient has become more weak over the past few days and she needs more help with his ADLs. Spouse also shared that patient is not eating as much.Spouse is open/wanting Hospice care for patient.  Spouse states that patient has a urology appointment tomorrow and is concerned about getting him there due to his weakness. Spouse also shared that she has her own doctors appointment coming up soon and is concerned about leaving patient at ome alone. Spouse has a list of resources from DSS, that she has gone through and has an appointment with Stanley Jackson care care manager about initiating in home respite care hours. SW also attempted to discuss placement with spouse, spouse shared that patient has declined placement more than once and is against leaving the home. SW reminded spouses that she has POA over patient which may be possible to activated - with patients Alzheimer - if a provider deems and documents that patient is no longer able to make sound/safe decisions for himself. Otherwise spouse would need get APS involved to assist. Spouse shared that she has a contact with DSS - Stanley Jackson [phone] - whom she spoke with sometime ago about resources, and she has outreached him with no response back.  SW placed TC to Stanley Jackson with DSS [phone] in an attempt to reconnect patient/spouse and obtain assistance with placement. No answer. SW LVM.

## 2022-09-08 NOTE — Telephone Encounter (Signed)
PC SW attempted to return patient/family TC x3, in regard to spouse request for in home resources. No answer/line busy.

## 2022-09-09 ENCOUNTER — Emergency Department: Payer: Medicare Other

## 2022-09-09 ENCOUNTER — Encounter: Payer: Self-pay | Admitting: Urology

## 2022-09-09 ENCOUNTER — Other Ambulatory Visit: Payer: Self-pay

## 2022-09-09 ENCOUNTER — Ambulatory Visit: Payer: Medicare Other | Admitting: Urology

## 2022-09-09 ENCOUNTER — Encounter: Payer: Self-pay | Admitting: *Deleted

## 2022-09-09 ENCOUNTER — Emergency Department
Admission: EM | Admit: 2022-09-09 | Discharge: 2022-09-09 | Disposition: A | Discharge status: Home / Self Care | Payer: Medicare Other | Attending: Emergency Medicine | Admitting: Emergency Medicine

## 2022-09-09 VITALS — BP 110/62 | HR 57 | Ht 72.0 in | Wt 130.0 lb

## 2022-09-09 DIAGNOSIS — N390 Urinary tract infection, site not specified: Secondary | ICD-10-CM | POA: Diagnosis not present

## 2022-09-09 DIAGNOSIS — W19XXXA Unspecified fall, initial encounter: Secondary | ICD-10-CM | POA: Insufficient documentation

## 2022-09-09 DIAGNOSIS — R4182 Altered mental status, unspecified: Secondary | ICD-10-CM | POA: Diagnosis not present

## 2022-09-09 DIAGNOSIS — R29898 Other symptoms and signs involving the musculoskeletal system: Secondary | ICD-10-CM

## 2022-09-09 DIAGNOSIS — R296 Repeated falls: Secondary | ICD-10-CM

## 2022-09-09 DIAGNOSIS — C61 Malignant neoplasm of prostate: Secondary | ICD-10-CM

## 2022-09-09 DIAGNOSIS — R350 Frequency of micturition: Secondary | ICD-10-CM | POA: Diagnosis present

## 2022-09-09 DIAGNOSIS — B9689 Other specified bacterial agents as the cause of diseases classified elsewhere: Secondary | ICD-10-CM | POA: Diagnosis not present

## 2022-09-09 DIAGNOSIS — F0390 Unspecified dementia without behavioral disturbance: Secondary | ICD-10-CM | POA: Insufficient documentation

## 2022-09-09 DIAGNOSIS — R972 Elevated prostate specific antigen [PSA]: Secondary | ICD-10-CM | POA: Diagnosis not present

## 2022-09-09 LAB — BASIC METABOLIC PANEL
Anion gap: 9 (ref 5–15)
BUN: 35 mg/dL — ABNORMAL HIGH (ref 8–23)
CO2: 27 mmol/L (ref 22–32)
Calcium: 10.1 mg/dL (ref 8.9–10.3)
Chloride: 108 mmol/L (ref 98–111)
Creatinine, Ser: 1.63 mg/dL — ABNORMAL HIGH (ref 0.61–1.24)
GFR, Estimated: 41 mL/min — ABNORMAL LOW (ref >60)
Glucose, Bld: 95 mg/dL (ref 70–99)
Potassium: 4.6 mmol/L (ref 3.5–5.1)
Sodium: 144 mmol/L (ref 135–145)

## 2022-09-09 LAB — CBC
HCT: 39.6 % (ref 39.0–52.0)
Hemoglobin: 12.7 g/dL — ABNORMAL LOW (ref 13.0–17.0)
MCH: 31.3 pg (ref 26.0–34.0)
MCHC: 32.1 g/dL (ref 30.0–36.0)
MCV: 97.5 fL (ref 80.0–100.0)
Platelets: 209 10*3/uL (ref 150–400)
RBC: 4.06 MIL/uL — ABNORMAL LOW (ref 4.22–5.81)
RDW: 13.2 % (ref 11.5–15.5)
WBC: 8.6 10*3/uL (ref 4.0–10.5)
nRBC: 0 % (ref 0.0–0.2)

## 2022-09-09 LAB — URINALYSIS, ROUTINE W REFLEX MICROSCOPIC
Bilirubin Urine: NEGATIVE
Glucose, UA: NEGATIVE mg/dL
Ketones, ur: NEGATIVE mg/dL
Nitrite: NEGATIVE
Protein, ur: 100 mg/dL — AB
Specific Gravity, Urine: 1.016 (ref 1.005–1.030)
Squamous Epithelial / HPF: NONE SEEN (ref 0–5)
WBC, UA: >50 WBC/hpf — ABNORMAL HIGH (ref 0–5)
pH: 5 (ref 5.0–8.0)

## 2022-09-09 LAB — TROPONIN I (HIGH SENSITIVITY)
Troponin I (High Sensitivity): 8 ng/L (ref <18)
Troponin I (High Sensitivity): 7 ng/L (ref <18)

## 2022-09-09 MED ORDER — SODIUM CHLORIDE 0.9 % IV SOLN
1.0000 g | Freq: Once | INTRAVENOUS | Status: AC
  Administered 2022-09-09: 1 g via INTRAVENOUS
  Filled 2022-09-09: qty 10

## 2022-09-09 MED ORDER — CEPHALEXIN 500 MG PO CAPS: 500.0000 mg | ORAL_CAPSULE | Freq: Two times a day (BID) | ORAL | 0 refills | Status: DC

## 2022-09-09 NOTE — Progress Notes (Signed)
09/09/2022 2:57 PM   Hoby L Mitcheltree [birth year] [id number]  Referring provider: Tracie Harrier, MD [address] [facility] [place],  [place] [address]  Chief Complaint  Patient presents with   Prostate Cancer    HPI: 86 y.o. male with dementia presents for follow-up visit for an elevated PSA.  He presents with his wife who is his healthcare power of attorney.  Initial visit Dr. Ginette Pitman 07/19/2022.   PSA drawn which was >150 Refer to my previous note 08/05/2022 CT of the abdomen pelvis with contrast performed 08/19/2022 showed a 8 x 13 mm left anterior periprosthetic lymph node suspicious for metastatic disease.  The prostate was markedly enlarged with irregular margins suggesting extracapsular extension.  Probable seminal vesicle involvement was noted bilaterally L >R.  No evidence of bony metastatic disease of the spine or pelvis His wife states he has refused biopsy or further evaluation. He has become progressively weaker and is not eating.  He is having multiple falls and she thinks she broke her wrist last night trying to keep him from falling They have an appointment tomorrow for hospice evaluation.   PMH: COPD Asthma Tobacco abuse Dementia  Surgical History: Knee arthroscopy Tonsillectomy  Home Medications:  Allergies as of 09/09/2022       Reactions   Alprazolam Other (See Comments)   Valproic Acid Other (See Comments)        Medication List        Accurate as of September 09, 2022  2:57 PM. If you have any questions, ask your nurse or doctor.          albuterol 108 (90 Base) MCG/ACT inhaler Commonly known as: VENTOLIN HFA Inhale 2 puffs into the lungs every 6 (six) hours as needed for wheezing or shortness of breath.   Anoro Ellipta 62.5-25 MCG/ACT Aepb Generic drug: umeclidinium-vilanterol Inhale 1 puff into the lungs daily.   aspirin EC 81 MG tablet Take 81 mg by mouth daily. Swallow whole.   loperamide 2 MG capsule Commonly  known as: IMODIUM Take 2 mg by mouth as needed for diarrhea or loose stools. 1 tablet by mouth 4 x daily as needed for diarrhea   mirtazapine 15 MG tablet Commonly known as: REMERON Take 15 mg by mouth at bedtime.   sertraline 100 MG tablet Commonly known as: ZOLOFT Take 100 mg by mouth daily.   tamsulosin 0.4 MG Caps capsule Commonly known as: FLOMAX Take 0.4 mg by mouth daily.        Allergies:  Allergies  Allergen Reactions   Alprazolam Other (See Comments)   Valproic Acid Other (See Comments)    Family History: No family history on file.  Social History:  reports that he has been smoking cigarettes. He has been smoking an average of .5 packs per day. He has never used smokeless tobacco. He reports that he does not drink alcohol and does not use drugs.   Physical Exam: BP 110/62   Pulse (!) 57   Ht 6' (1.829 m)   Wt 130 lb (59 kg)   BMI 17.63 kg/m   Constitutional:  Alert, No acute distress. HEENT: [place] AT. Respiratory: Normal respiratory effort, no increased work of breathing. Skin: No rashes, bruises or suspicious lesions.  Pertinent imaging: CT images were personally reviewed and interpreted.  I calculated prostate volume at 116 cc.  There is an intravesical median lobe versus extension of prostate cancer into the bladder  Assessment & Plan:    1.  Prostate cancer PSA and CT indicative of prostate cancer with metastatic disease He has refused prostate biopsy.  Wife is HCPOA but is letting him make the decision Recent increased falls and she is having trouble caring for him. We discussed referral to oncology for consideration of starting ADT without tissue diagnosis I recommended ED eval for his progressive weakness, falls I also recommended she get evaluated for her wrist injury I spoke with the ED triage nurse and they were taken to the ED by office staff   Abbie Sons, [facility] [address], [place], [place] [address] [phone]

## 2022-09-09 NOTE — ED Notes (Signed)
Pt unable to void at this time. 

## 2022-09-09 NOTE — ED Provider Notes (Signed)
[facility] Provider Note    Event Date/Time   First MD Initiated Contact with Patient 09/09/22 1821     (approximate)  History   Chief Complaint: Altered Mental Status and Fall  HPI  Tharun L Cramer is a 86 y.o. male with a past medical history of dementia who presents to the emergency department for several falls recently.  According to report per friend patient had several falls and was complaining of urinary frequency.  Here the patient denies any dysuria.  Denies any abdominal pain nausea vomiting or diarrhea.  Denies any injuries from the falls.  Physical Exam   Triage Vital Signs: ED Triage Vitals  Enc Vitals Group     BP 09/09/22 1540 129/68     Pulse Rate 09/09/22 1540 61     Resp 09/09/22 1541 18     Temp 09/09/22 1540 97.7 F (36.5 C)     Temp Source 09/09/22 1540 Oral     SpO2 09/09/22 1540 96 %     Weight 09/09/22 1536 130 lb 1.1 oz (59 kg)     Height 09/09/22 1536 6' (1.829 m)     Head Circumference --      Peak Flow --      Pain Score 09/09/22 1536 0     Pain Loc --      Pain Edu? --      Excl. in [place]? --     Most recent vital signs: Vitals:   09/09/22 1540 09/09/22 1541  BP: 129/68   Pulse: 61   Resp:  18  Temp: 97.7 F (36.5 C)   SpO2: 96%     General: Awake, no distress.  CV:  Good peripheral perfusion.  Regular rate and rhythm  Resp:  Normal effort.  Equal breath sounds bilaterally.  Abd:  No distention.  Soft, nontender.  No rebound or guarding. Other:  No signs of head trauma.   ED Results / Procedures / Treatments   EKG  EKG viewed and interpreted by myself shows what appears to be a sinus rhythm at 73 bpm with occasional PAC, narrow QRS, normal axis, normal intervals, nonspecific ST changes.  RADIOLOGY  I have reviewed and interpreted the chest x-ray images.  No large consolidation seen on my evaluation. Radiology is read the x-ray is negative   MEDICATIONS ORDERED IN ED: Medications  cefTRIAXone  (ROCEPHIN) 1 g in sodium chloride 0.9 % 100 mL IVPB (has no administration in time range)     IMPRESSION / MDM / ASSESSMENT AND PLAN / ED COURSE  I reviewed the triage vital signs and the nursing notes.  [name] presentation is most consistent with acute presentation with potential threat to life or bodily function.  Patient presents emergency department with recent falls and urinary frequency.  Overall the patient appears well, no distress.  Patient has no complaints.  Patient appears well.  Given the complaint of recent falls we will obtain CT imaging the head as a precaution.  [name] lab work shows a reassuring CBC, reassuring chemistry.  [name] urinalysis appears to show significant urinary tract infection.  We will dose IV Rocephin we will send a urine culture.  We will obtain CT imaging of the head as a precaution as well.  Per report patient's wife is also in the emergency department for recent fall/injury.  CT scan of the head is normal.  Patient continues to appear well.  Has received IV Rocephin.  We will discharge with 7 days  of Keflex have the patient follow-up with his doctor.  Urine culture has been sent.  Patient agreeable to plan of care.  Wife is currently in the emergency department as well or she had fallen, but plan to be discharged as well.  FINAL CLINICAL IMPRESSION(S) / ED DIAGNOSES   Fall Urinary tract infection  Note:  This document was prepared using Dragon voice recognition software and may include unintentional dictation errors.   Harvest Dark, MD 09/09/22 [phone]

## 2022-09-09 NOTE — ED Notes (Signed)
Wife at bedside appears upset about patient being discharged.  Attempted to understand situation and wife continued to ask when patient was going to be discharged and where his paperwork was.  Reports her granddaughter is coming to get them and will be able to assist her/them.  Wife demanding to wait outside until family member arrives for discharge.  Pt has not complaints.  Attempted to provide discharge teaching and patient's wife continued to complain.  Unable to assist her with concerns.  States there is a nurse coming to assist them tomorrow morning.  Patient and wife assisted outside in wheelchair per request.  Would not let RN provide additional assistance.

## 2022-09-09 NOTE — Discharge Instructions (Signed)
Please take your antibiotics as prescribed for their entire course.  Return to the emergency department for any worsening confusion, any fever, any further falls, or any other symptom personally concerning to yourself.

## 2022-09-09 NOTE — ED Triage Notes (Signed)
Pt brought in by a friend.  Pt has dementia.  Hx prostate ca.  Pt has had recent falls.  No blood thinners.   Decreased appetite, urinary frequency  pt alert.

## 2022-09-10 LAB — URINE CULTURE

## 2022-09-15 ENCOUNTER — Other Ambulatory Visit: Payer: Self-pay | Admitting: Urology

## 2022-09-15 DIAGNOSIS — C61 Malignant neoplasm of prostate: Secondary | ICD-10-CM

## 2022-09-22 ENCOUNTER — Inpatient Hospital Stay: Payer: Medicare Other | Attending: Internal Medicine | Admitting: Internal Medicine

## 2022-09-22 ENCOUNTER — Encounter: Payer: Self-pay | Admitting: Internal Medicine

## 2022-09-22 ENCOUNTER — Inpatient Hospital Stay: Payer: Medicare Other

## 2022-09-22 DIAGNOSIS — G309 Alzheimer's disease, unspecified: Secondary | ICD-10-CM

## 2022-09-22 DIAGNOSIS — C61 Malignant neoplasm of prostate: Secondary | ICD-10-CM | POA: Insufficient documentation

## 2022-09-22 DIAGNOSIS — F17210 Nicotine dependence, cigarettes, uncomplicated: Secondary | ICD-10-CM | POA: Diagnosis not present

## 2022-09-22 DIAGNOSIS — N183 Chronic kidney disease, stage 3 unspecified: Secondary | ICD-10-CM | POA: Insufficient documentation

## 2022-09-22 DIAGNOSIS — F02C4 Dementia in other diseases classified elsewhere, severe, with anxiety: Secondary | ICD-10-CM | POA: Diagnosis not present

## 2022-09-22 NOTE — Progress Notes (Unsigned)
[place] OFFICE PROGRESS NOTE  Patient Care Team: Tracie Harrier, MD as PCP - General (Internal Medicine) Tamala Julian Jonette Eva, NP as Nurse Practitioner (Hospice and Palliative Medicine) Abbie Sons, MD as Consulting Physician (Urology) Kerry Dory, PA-C (Physician Assistant)   Cancer Staging  Prostate cancer [facility]) Staging form: Prostate, AJCC 8th Edition - Clinical: Stage IVA (cT3b, cN1, cM0) - Signed by Cammie Sickle, MD on 09/22/2022   Oncology History Overview Note  IMPRESSION: Markedly enlarged prostate, with probable extracapsular extension and seminal vesicle involvement, greatest on the left. Bladder invasion cannot be excluded.   13 mm left anterior periprostatic/pelvic floor lymph node, suspicious for metastatic disease.   No other sites of metastatic disease identified within the pelvis or abdomen.     Electronically Signed   By: Marlaine Hind M.D.   On: 08/19/2022 15:48   Prostate cancer ([place])  09/22/2022 Initial Diagnosis   Prostate cancer ([place])   09/22/2022 Cancer Staging   Staging form: Prostate, AJCC 8th Edition - Clinical: Stage IVA (cT3b, cN1, cM0) - Signed by Cammie Sickle, MD on 09/22/2022      HISTORY OF PRESENT ILLNESS: Walks with a walker.  By his wife.  Patient reports that given his dementia.  Stanley Jackson 86 y.o.  male pleasant patient with moderate to severe dementia above history of suspected prostate cancer has been referred to [place] by urology/radiation oncology to discuss further evaluation/treatment.  Given patient's dementia, patient cannot tell me the reason for today's visit with me.  [name] wife is the historian.  In July 2023 patient noted to have elevated PSA with PCP more than 150.  Patient was further evaluated by urology.  August 2023 CT of the abdomen pelvis with contrast performed 08/19/2022 showed a 8 x 13 mm left anterior periprosthetic lymph node suspicious for metastatic disease.   The prostate was markedly enlarged with irregular margins suggesting extracapsular extension.  Probable seminal vesicle involvement was noted bilaterally L >R.  No evidence of bony metastatic disease of the spine or pelvis.  Patient declined further biopsy/evaluation.  As per the wife patient has been progressively weak for appetite.  Positive for weight loss.  Also has multiple falls.  Patient is incontinence.  Patient is not in pain.  Patient denies any pain.  Denies any nausea vomiting abdominal pain.   Review of Systems  Unable to perform ROS: Dementia      PAST MEDICAL HISTORY :  Past Medical History:  Diagnosis Date   Alzheimer disease ([place])    Anemia    Anxiety    COPD (chronic obstructive pulmonary disease) ([place])    Depression    Hx of tonsillectomy    Prostate cancer ([place])     PAST SURGICAL HISTORY :   Past Surgical History:  Procedure Laterality Date   KNEE ARTHROSCOPY Right       FAMILY HISTORY :   Family History  Problem Relation Age of Onset   Cancer Neg Hx     SOCIAL HISTORY:   Social History   Tobacco Use   Smoking status: Every Day    Packs/day: 0.25    Years: 72.00    Total pack years: 18.00    Types: Cigarettes   Smokeless tobacco: Never  Vaping Use   Vaping Use: Never used  Substance Use Topics   Alcohol use: No    Comment: quit a few years ago   Drug use: Never    ALLERGIES:  is allergic to  alprazolam and valproic acid.  MEDICATIONS:  Current Outpatient Medications  Medication Sig Dispense Refill   albuterol (VENTOLIN HFA) 108 (90 Base) MCG/ACT inhaler Inhale 2 puffs into the lungs every 6 (six) hours as needed for wheezing or shortness of breath.     aspirin EC 81 MG tablet Take 81 mg by mouth daily. Swallow whole.     cyanocobalamin (VITAMIN B12) 1000 MCG tablet Take 1,000 mcg by mouth daily.     loperamide (IMODIUM) 2 MG capsule Take 2 mg by mouth as needed for diarrhea or loose stools. 1 tablet by mouth 4 x daily as needed for  diarrhea     mirtazapine (REMERON) 15 MG tablet Take 15 mg by mouth at bedtime.     sertraline (ZOLOFT) 100 MG tablet Take 100 mg by mouth daily.     tamsulosin (FLOMAX) 0.4 MG CAPS capsule Take 0.4 mg by mouth daily.     umeclidinium-vilanterol (ANORO ELLIPTA) 62.5-25 MCG/INH AEPB Inhale 1 puff into the lungs daily.     cephALEXin (KEFLEX) 500 MG capsule Take 1 capsule (500 mg total) by mouth 2 (two) times daily. (Patient not taking: Reported on 09/22/2022) 14 capsule 0   No current facility-administered medications for this visit.    PHYSICAL EXAMINATION:   BP (!) 123/49 (BP Location: Left Arm, Patient Position: Sitting)   Pulse (!) 50   Temp (!) 96.5 F (35.8 C) (Tympanic)   Resp 16   Wt 132 lb 9.6 oz (60.1 kg)   BMI 17.98 kg/m   Filed Weights   09/22/22 1400  Weight: 132 lb 9.6 oz (60.1 kg)   Oriented times 1-2.  Frail-appearing.  Walks with a walker.  Physical Exam Vitals and nursing note reviewed.  HENT:     Head: Normocephalic and atraumatic.     Mouth/Throat:     Pharynx: Oropharynx is clear.  Eyes:     Extraocular Movements: Extraocular movements intact.     Pupils: Pupils are equal, round, and reactive to light.  Cardiovascular:     Rate and Rhythm: Normal rate and regular rhythm.  Pulmonary:     Comments: Decreased breath sounds bilaterally.  Abdominal:     Palpations: Abdomen is soft.  Musculoskeletal:        General: Normal range of motion.     Cervical back: Normal range of motion.  Skin:    General: Skin is warm.  Neurological:     General: No focal deficit present.     Mental Status: He is alert.  Psychiatric:        Behavior: Behavior normal.      LABORATORY DATA:  I have reviewed the data as listed    Component Value Date/Time   NA 144 09/09/2022 1546   K 4.6 09/09/2022 1546   CL 108 09/09/2022 1546   CO2 27 09/09/2022 1546   GLUCOSE 95 09/09/2022 1546   BUN 35 (H) 09/09/2022 1546   CREATININE 1.63 (H) 09/09/2022 1546   CALCIUM 10.1  09/09/2022 1546   GFRNONAA 41 (L) 09/09/2022 1546    No results found for: "SPEP", "UPEP"  Lab Results  Component Value Date   WBC 8.6 09/09/2022   HGB 12.7 (L) 09/09/2022   HCT 39.6 09/09/2022   MCV 97.5 09/09/2022   PLT 209 09/09/2022      Chemistry      Component Value Date/Time   NA 144 09/09/2022 1546   K 4.6 09/09/2022 1546   CL 108 09/09/2022 1546   CO2  27 09/09/2022 1546   BUN 35 (H) 09/09/2022 1546   CREATININE 1.63 (H) 09/09/2022 1546      Component Value Date/Time   CALCIUM 10.1 09/09/2022 1546       RADIOGRAPHIC STUDIES: I have personally reviewed the radiological images as listed and agreed with the findings in the report. No results found.   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  Extension.  However given the significant elevated PSA and clinical/imaging findings highly suggestive of prostate cancer.  I reviewed the stage with the patient and wife in detail.  Discussed that unfortunately patient's prostate cancer cannot be cured.  However it could be treated-with ADT on a palliative basis.  Patient is not a candidate for any surgery or radiation.  #However patient declines any further therapies he is not interested in a biopsy or any further work-up.  Which I think is very reasonable given patient's ongoing dementia/poor performance status etc.    #  Alzheimer-moderate to severe-progressively getting worse as per wife.  Defer to PCP  # CKD- Stage III-   #I had a long discussion with the patient and his wife regarding-his plan of care.  Patient would be a candidate for hospice, however patient's wife concerned about restriction access to medical care in hospice.  She declines hospice at this time.  Patient being followed by palliative care at home which I think is  reasonable.  Discussed with Merrily Pew Borders regarding coordination of care.  Thank you Dr.Stoioff for allowing me to participate in the care of your pleasant patient. Please do not hesitate to contact me with questions or concerns in the interim.   # DISPOSITION: # needs to transport home.  # no labs- # follow up as needed-Dr.B  # 60 minutes face-to-face with the patient discussing the above plan of care; more than 50% of time spent on prognosis/ natural history; counseling and coordination.  Drs.Stoioff; Hande   No orders of the defined types were placed in this encounter.  All questions were answered. The patient knows to call the clinic with any problems, questions or concerns.      Cammie Sickle, MD 09/23/2022 8:03 AM

## 2022-09-22 NOTE — Assessment & Plan Note (Addendum)
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  Extension.  However given the significant elevated PSA and clinical/imaging findings highly suggestive of prostate cancer.  I reviewed the stage with the patient and wife in detail.  Discussed that unfortunately patient's prostate cancer cannot be cured.  However it could be treated-with ADT on a palliative basis.  Patient is not a candidate for any surgery or radiation.  #However patient declines any further therapies he is not interested in a biopsy or any further work-up.  Which I think is very reasonable given patient's ongoing dementia/poor performance status etc.    #  Alzheimer-moderate to severe-progressively getting worse as per wife.  Defer to PCP  # CKD- Stage III-   #I had a long discussion with the patient and his wife regarding-his plan of care.  Patient would be a candidate for hospice, however patient's wife concerned about restriction access to medical care in hospice.  She declines hospice at this time.  Patient being followed by palliative care at home which I think is reasonable.  Discussed with Merrily Pew Borders regarding coordination of care.  Thank you Dr.Stoioff for allowing me to participate in the care of your pleasant patient. Please do not hesitate to contact me with questions or concerns in the interim.   # DISPOSITION: # needs to transport home.  # no labs- # follow up as needed-Dr.B  # 60 minutes face-to-face with the patient discussing the above plan of care; more than 50% of time spent on prognosis/ natural history; counseling and coordination.  Drs.Stoioff; Hande

## 2022-09-22 NOTE — Progress Notes (Signed)
New patient evaluation accompanied by healthcare POA Diane [place].

## 2022-10-06 ENCOUNTER — Telehealth: Payer: Medicare Other | Admitting: Hospice and Palliative Medicine

## 2022-10-08 ENCOUNTER — Telehealth: Payer: Self-pay

## 2022-10-08 NOTE — Telephone Encounter (Signed)
120 pm.  Phone call made to wife [name].  Home visit scheduled for Tuesday at 11 am.

## 2022-10-11 ENCOUNTER — Inpatient Hospital Stay: Payer: Medicare Other | Attending: Internal Medicine | Admitting: Hospice and Palliative Medicine

## 2022-10-11 DIAGNOSIS — C61 Malignant neoplasm of prostate: Secondary | ICD-10-CM | POA: Diagnosis not present

## 2022-10-11 NOTE — Progress Notes (Signed)
Virtual Visit via Telephone Note  I connected with Stanley Jackson on 10/11/22 at 11:30 AM EDT by telephone and verified that I am speaking with the correct person using two identifiers.  Location: Patient: Clinic Provider: Home   I discussed the limitations, risks, security and privacy concerns of performing an evaluation and management service by telephone and the availability of in person appointments. I also discussed with the patient that there may be a patient responsible charge related to this service. The patient expressed understanding and agreed to proceed.   History of Present Illness: Stanley Jackson is an 86 year old man with multiple medical problems including stage IVa prostate cancer metastatic to lymph node and dementia.  Patient has been declining with poor performance status, multiple falls, and poor oral intake/weight loss.  He was referred to palliative care to address goals.   Observations/Objective: I called and had a lengthy conversation with patient's wife.  [name] involvement was limited based on his dementia.  Patient has been seen in consultation by medical oncology and wife declined further work-up/treatment.  Hospice was recommended.  Patient was seen by hospice about a month ago and wife perceived hospice involvement as causing a loss in healthcare insurance.  She therefore opted not to proceed with hospice care.  In the interim, she states that patient is declining and care is becoming more burdensome to manage at home.  It is becoming increasingly difficult to get patient out of the home for medical appointments.  I had a lengthy conversation with patient's wife about hospice care and what that would entail.  Additionally, we discussed the hospice Medicare benefit at length and the pros and cons of early hospice involvement.  Ultimately, patient's wife verbalized agreement with proceeding with a new hospice referral.  I called and spoke with hospice CMO and requested  that he help coordinate.  Assessment and Plan: Metastatic prostate cancer -proceed with hospice referral  Follow Up Instructions: As needed   I discussed the assessment and treatment plan with the patient. The patient was provided an opportunity to ask questions and all were answered. The patient agreed with the plan and demonstrated an understanding of the instructions.   The patient was advised to call back or seek an in-person evaluation if the symptoms worsen or if the condition fails to improve as anticipated.  I provided 35 minutes of non-face-to-face time during this encounter.   Irean Hong, NP

## 2022-10-12 ENCOUNTER — Telehealth: Payer: Self-pay

## 2022-10-12 ENCOUNTER — Other Ambulatory Visit: Payer: Medicare Other

## 2022-10-12 NOTE — Telephone Encounter (Signed)
1120 am. Return call made to spouse to advise visit has been canceled as patient has a hospice assessment visit scheduled for today at 2 pm.

## 2022-10-13 ENCOUNTER — Inpatient Hospital Stay (HOSPITAL_BASED_OUTPATIENT_CLINIC_OR_DEPARTMENT_OTHER): Payer: Medicare Other | Admitting: Hospice and Palliative Medicine

## 2022-10-13 DIAGNOSIS — C61 Malignant neoplasm of prostate: Secondary | ICD-10-CM

## 2022-10-13 NOTE — Progress Notes (Signed)
Multidisciplinary Oncology Council Documentation  Stanley Jackson was presented by our [facility] on 10/13/2022, which included representatives from:  Palliative Care Dietitian  Physical/Occupational Therapist Nurse Navigator Genetics Speech Therapist Social work Survivorship Therapist, occupational Research RN   Stanley Jackson currently presents with history of prostate cancer  We reviewed previous medical and familial history, history of present illness, and recent lab results along with all available histopathologic and imaging studies. The [place] considered available treatment options and made the following recommendations/referrals:  None  The MOC is a meeting of clinicians from various specialty areas who evaluate and discuss patients for whom a multidisciplinary approach is being considered. Final determinations in the plan of care are those of the provider(s).   Today's extended care, comprehensive team conference, Stanley Jackson was not present for the discussion and was not examined.

## 2022-12-27 DEATH — deceased
